# Patient Record
Sex: Female | Born: 1970
Health system: Southern US, Community
[De-identification: ages and names within clinical notes are randomized; demographics above are authoritative.]

## PROBLEM LIST (undated history)

## (undated) DIAGNOSIS — M1990 Unspecified osteoarthritis, unspecified site: Secondary | ICD-10-CM

## (undated) DIAGNOSIS — N8501 Benign endometrial hyperplasia: Secondary | ICD-10-CM

## (undated) DIAGNOSIS — R011 Cardiac murmur, unspecified: Secondary | ICD-10-CM

## (undated) DIAGNOSIS — I10 Essential (primary) hypertension: Secondary | ICD-10-CM

## (undated) DIAGNOSIS — J309 Allergic rhinitis, unspecified: Secondary | ICD-10-CM

## (undated) HISTORY — PX: ENDOMETRIAL BIOPSY: SHX622

## (undated) HISTORY — DX: Benign endometrial hyperplasia: N85.01

## (undated) HISTORY — DX: Unspecified osteoarthritis, unspecified site: M19.90

## (undated) HISTORY — PX: BREAST BIOPSY: SHX20

## (undated) HISTORY — DX: Allergic rhinitis, unspecified: J30.9

## (undated) HISTORY — DX: Essential (primary) hypertension: I10

## (undated) HISTORY — DX: Cardiac murmur, unspecified: R01.1

---

## 2007-02-06 ENCOUNTER — Ambulatory Visit: Payer: Self-pay

## 2007-05-31 DIAGNOSIS — D509 Iron deficiency anemia, unspecified: Secondary | ICD-10-CM | POA: Insufficient documentation

## 2008-02-07 ENCOUNTER — Ambulatory Visit: Payer: Self-pay | Admitting: Family Medicine

## 2008-03-21 HISTORY — PX: HYSTEROSCOPY WITH D & C: SHX1775

## 2008-07-31 ENCOUNTER — Ambulatory Visit: Payer: Self-pay | Admitting: Family Medicine

## 2008-07-31 DIAGNOSIS — N920 Excessive and frequent menstruation with regular cycle: Secondary | ICD-10-CM | POA: Insufficient documentation

## 2008-08-14 DIAGNOSIS — R030 Elevated blood-pressure reading, without diagnosis of hypertension: Secondary | ICD-10-CM | POA: Insufficient documentation

## 2009-01-21 ENCOUNTER — Ambulatory Visit: Payer: Self-pay | Admitting: Unknown Physician Specialty

## 2009-01-29 DIAGNOSIS — E669 Obesity, unspecified: Secondary | ICD-10-CM | POA: Insufficient documentation

## 2009-02-03 ENCOUNTER — Ambulatory Visit: Payer: Self-pay | Admitting: Unknown Physician Specialty

## 2009-02-09 ENCOUNTER — Ambulatory Visit: Payer: Self-pay | Admitting: Family Medicine

## 2009-09-07 DIAGNOSIS — L989 Disorder of the skin and subcutaneous tissue, unspecified: Secondary | ICD-10-CM | POA: Insufficient documentation

## 2009-12-29 ENCOUNTER — Ambulatory Visit: Payer: Self-pay | Admitting: Family Medicine

## 2010-12-30 ENCOUNTER — Ambulatory Visit: Payer: Self-pay | Admitting: Family Medicine

## 2012-01-04 ENCOUNTER — Ambulatory Visit: Payer: Self-pay | Admitting: Family Medicine

## 2012-12-27 ENCOUNTER — Ambulatory Visit: Payer: Self-pay | Admitting: Family Medicine

## 2013-12-19 LAB — LIPID PANEL
Cholesterol: 192 mg/dL (ref 0–200)
HDL: 70 mg/dL (ref 35–70)
LDL Cholesterol: 112 mg/dL
Triglycerides: 51 mg/dL (ref 40–160)

## 2013-12-19 LAB — BASIC METABOLIC PANEL
BUN: 18 mg/dL (ref 4–21)
CREATININE: 0.7 mg/dL (ref 0.5–1.1)
POTASSIUM: 4.4 mmol/L (ref 3.4–5.3)
Sodium: 140 mmol/L (ref 137–147)

## 2013-12-19 LAB — HM PAP SMEAR: HM Pap smear: NEGATIVE

## 2013-12-19 LAB — FECAL OCCULT BLOOD, GUAIAC: FECAL OCCULT BLD: NEGATIVE

## 2013-12-19 LAB — TSH: TSH: 2.09 u[IU]/mL (ref 0.41–5.90)

## 2014-01-07 ENCOUNTER — Ambulatory Visit: Payer: Self-pay | Admitting: Family Medicine

## 2014-01-23 ENCOUNTER — Ambulatory Visit: Payer: Self-pay | Admitting: Family Medicine

## 2014-02-04 ENCOUNTER — Ambulatory Visit: Payer: Self-pay | Admitting: Family Medicine

## 2014-03-28 LAB — CBC AND DIFFERENTIAL
HCT: 42 % (ref 36–46)
HEMOGLOBIN: 13.4 g/dL (ref 12.0–16.0)
Platelets: 257 10*3/uL (ref 150–399)
WBC: 6.9 10*3/mL

## 2014-07-14 LAB — SURGICAL PATHOLOGY

## 2014-12-22 ENCOUNTER — Other Ambulatory Visit: Payer: Self-pay | Admitting: Family Medicine

## 2014-12-22 DIAGNOSIS — Z1231 Encounter for screening mammogram for malignant neoplasm of breast: Secondary | ICD-10-CM

## 2015-01-05 ENCOUNTER — Ambulatory Visit (INDEPENDENT_AMBULATORY_CARE_PROVIDER_SITE_OTHER): Payer: BLUE CROSS/BLUE SHIELD | Admitting: Family Medicine

## 2015-01-05 ENCOUNTER — Encounter: Payer: Self-pay | Admitting: Family Medicine

## 2015-01-05 VITALS — BP 110/60 | HR 56 | Temp 97.9°F | Resp 20 | Ht 64.0 in | Wt 173.0 lb

## 2015-01-05 DIAGNOSIS — T50905A Adverse effect of unspecified drugs, medicaments and biological substances, initial encounter: Secondary | ICD-10-CM

## 2015-01-05 DIAGNOSIS — Z0000 Encounter for general adult medical examination without abnormal findings: Secondary | ICD-10-CM

## 2015-01-05 DIAGNOSIS — R031 Nonspecific low blood-pressure reading: Secondary | ICD-10-CM | POA: Insufficient documentation

## 2015-01-05 DIAGNOSIS — J309 Allergic rhinitis, unspecified: Secondary | ICD-10-CM | POA: Insufficient documentation

## 2015-01-05 DIAGNOSIS — R001 Bradycardia, unspecified: Secondary | ICD-10-CM | POA: Insufficient documentation

## 2015-01-05 DIAGNOSIS — T304 Corrosion of unspecified body region, unspecified degree: Secondary | ICD-10-CM | POA: Insufficient documentation

## 2015-01-05 DIAGNOSIS — J329 Chronic sinusitis, unspecified: Secondary | ICD-10-CM | POA: Insufficient documentation

## 2015-01-05 LAB — POCT URINALYSIS DIPSTICK
Bilirubin, UA: NEGATIVE
Blood, UA: NEGATIVE
Glucose, UA: NEGATIVE
KETONES UA: NEGATIVE
LEUKOCYTES UA: NEGATIVE
Nitrite, UA: NEGATIVE
PH UA: 6
PROTEIN UA: NEGATIVE
SPEC GRAV UA: 1.02
UROBILINOGEN UA: 0.2
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
## 2015-01-06 ENCOUNTER — Telehealth: Payer: Self-pay

## 2015-01-06 LAB — LIPID PANEL WITH LDL/HDL RATIO
Cholesterol, Total: 182 mg/dL (ref 100–199)
HDL: 65 mg/dL (ref >39)
LDL CALC: 103 mg/dL — AB (ref 0–99)
LDL/HDL RATIO: 1.6 ratio (ref 0.0–3.2)
Triglycerides: 70 mg/dL (ref 0–149)
VLDL Cholesterol Cal: 14 mg/dL (ref 5–40)

## 2015-01-06 LAB — TSH: TSH: 3.45 u[IU]/mL (ref 0.450–4.500)

## 2015-01-06 LAB — HEMOGLOBIN A1C
Est. average glucose Bld gHb Est-mCnc: 108 mg/dL
HEMOGLOBIN A1C: 5.4 % (ref 4.8–5.6)

## 2015-01-06 NOTE — Telephone Encounter (Signed)
-----   Message from Margarita Rana, MD sent at 01/06/2015  7:01 AM EDT ----- Labs stable. Please notify patient. Thanks.

## 2015-01-06 NOTE — Telephone Encounter (Signed)
LMTCB 01/06/2015  Thanks,   -Laura 

## 2015-01-06 NOTE — Telephone Encounter (Signed)
Pt advised.   Thanks,   -Flois Mctague  

## 2015-01-06 NOTE — Telephone Encounter (Signed)
Pt is returning call.  CB#[phone]/MW

## 2015-01-09 ENCOUNTER — Ambulatory Visit: Payer: Self-pay

## 2015-01-12 ENCOUNTER — Ambulatory Visit
Admission: RE | Admit: 2015-01-12 | Discharge: 2015-01-12 | Disposition: A | Discharge status: Home / Self Care | Payer: BLUE CROSS/BLUE SHIELD | Source: Ambulatory Visit | Attending: Family Medicine | Admitting: Family Medicine

## 2015-01-12 DIAGNOSIS — Z1231 Encounter for screening mammogram for malignant neoplasm of breast: Secondary | ICD-10-CM | POA: Insufficient documentation

## 2015-03-05 ENCOUNTER — Other Ambulatory Visit: Payer: Self-pay | Admitting: Family Medicine

## 2015-04-13 ENCOUNTER — Encounter: Payer: Self-pay | Admitting: Family Medicine

## 2015-04-13 ENCOUNTER — Ambulatory Visit (INDEPENDENT_AMBULATORY_CARE_PROVIDER_SITE_OTHER): Payer: BLUE CROSS/BLUE SHIELD | Admitting: Family Medicine

## 2015-04-13 VITALS — BP 118/84 | HR 54 | Temp 97.7°F | Resp 16 | Wt 178.6 lb

## 2015-04-13 DIAGNOSIS — J0100 Acute maxillary sinusitis, unspecified: Secondary | ICD-10-CM | POA: Diagnosis not present

## 2015-04-13 MED ORDER — AMOXICILLIN-POT CLAVULANATE 875-125 MG PO TABS: 1.0000 | ORAL_TABLET | Freq: Two times a day (BID) | ORAL | Status: DC

## 2015-04-13 NOTE — Progress Notes (Signed)
Subjective:     Patient ID: Monica Nelson, female   DOB: [birth year], 45 y.o.   MRN: [id number]  HPI  Chief Complaint  Patient presents with  . Sinus Problem    Patient comes in office today with concerns of sinus pain and pressure for the past 4 weeks. Patient states that pressure is around her entire head and she has noticed popping in both ears. Associated symptoms include: non productive cough, post nasal drip and congestion. Patient has tried otc Dayquil, Mucinex and Mucinex Maximum relief.   States her sx started out like a cold 4 weeks ago, now congestion is locked in. Gets transient improvement with otc medication. Has continued her allergy medication-steroid nasal spray and antihistamine-as she is exposed to allergens in her job at an [employer].   Review of Systems     Objective:   Physical Exam  Constitutional: She appears well-developed and well-nourished. No distress.  Ears: Left T.M intact without inflammation, Right obscured by cerumen Sinuses: mild maxillary sinus tenderness Throat: no tonsillar enlargement or exudate Neck: no cervical adenopathy Lungs: clear     Assessment:    1. Acute maxillary sinusitis, recurrence not specified - amoxicillin-clavulanate (AUGMENTIN) 875-125 MG tablet; Take 1 tablet by mouth 2 (two) times daily.  Dispense: 20 tablet; Refill: 0    Plan:   Discussed use of otc medication.

## 2015-04-13 NOTE — Patient Instructions (Signed)
Try Mucinex D for congestion, Delsym for cough, and continue steroid nasal spray.

## 2015-05-28 ENCOUNTER — Telehealth: Payer: Self-pay | Admitting: Family Medicine

## 2015-05-28 NOTE — Telephone Encounter (Signed)
Pt states she has her yearly check up with [facility] and was told she has a functional heat mummer.  Pt is requesting a call back from Dr Venia Minks to discuss this.  CB#[phone]/MW

## 2015-05-28 NOTE — Telephone Encounter (Signed)
Spoke with pt and scheduled appt for 06/04/15. Thanks TNP

## 2015-05-28 NOTE — Telephone Encounter (Signed)
Patient reports that she has a heart murmur. Should I schedule patient an appt to discuss this? Please advise. Thanks!

## 2015-05-28 NOTE — Telephone Encounter (Signed)
Will schedule appointment to assess. Thanks.

## 2015-05-31 ENCOUNTER — Other Ambulatory Visit: Payer: Self-pay | Admitting: Family Medicine

## 2015-05-31 DIAGNOSIS — J3089 Other allergic rhinitis: Secondary | ICD-10-CM

## 2015-06-03 ENCOUNTER — Other Ambulatory Visit: Payer: Self-pay | Admitting: Family Medicine

## 2015-06-03 DIAGNOSIS — J3089 Other allergic rhinitis: Secondary | ICD-10-CM

## 2015-06-04 ENCOUNTER — Encounter: Payer: Self-pay | Admitting: Family Medicine

## 2015-06-04 ENCOUNTER — Ambulatory Visit (INDEPENDENT_AMBULATORY_CARE_PROVIDER_SITE_OTHER): Payer: BLUE CROSS/BLUE SHIELD | Admitting: Family Medicine

## 2015-06-04 VITALS — BP 134/82 | HR 92 | Temp 98.4°F | Resp 16 | Wt 178.0 lb

## 2015-06-04 DIAGNOSIS — R011 Cardiac murmur, unspecified: Secondary | ICD-10-CM

## 2015-06-04 DIAGNOSIS — D509 Iron deficiency anemia, unspecified: Secondary | ICD-10-CM

## 2015-06-04 NOTE — Progress Notes (Signed)
   Subjective:    Patient ID: Monica Nelson, female    DOB: [birth year], 45 y.o.   MRN: [id number]  HPI  Heart Murmur Pt had her yearly check up with [facility] last week and was told she has a functional heart murmur by Dalia Heading, CNM. Pt comes in today to have this assessed. Pt denies previous history of heart murmur or rheumatic fever. [facility] did not check labs. Has not current symptoms. Feels well. No SOB, not chest pain, no fatigue, no syncope, no irregular heart beat.    Review of Systems  Constitutional: Negative for fever, chills, diaphoresis, activity change, appetite change, fatigue and unexpected weight change.  Respiratory: Negative for cough, chest tightness, shortness of breath and wheezing.   Cardiovascular: Negative for chest pain, palpitations and leg swelling.  Neurological: Negative for syncope.       Objective:   Physical Exam  Constitutional: She appears well-developed and well-nourished.  Cardiovascular:  Murmur heard.  Systolic murmur is present with a grade of 2/6  Psychiatric: She has a normal mood and affect. Her behavior is normal.   BP 134/82 mmHg  Pulse 92  Temp(Src) 98.4 F (36.9 C) (Oral)  Resp 16  Wt 178 lb (80.74 kg)     Assessment & Plan:  1. Systolic ejection murmur New diagnosis. No current symptoms. Will refer to cardiology.   Does have some subtle EKG changes noted.  - Ambulatory referral to Cardiology - EKG 12-Lead  2. Anemia, iron deficiency Will check labs.  - CBC with Differential/Platelet   Patient was seen and examined by Jerrell Belfast, MD, and note scribed by Renaldo Fiddler, CMA. I have reviewed the document for accuracy and completeness and I agree with above. Jerrell Belfast, MD   Margarita Rana, MD

## 2015-06-08 DIAGNOSIS — R011 Cardiac murmur, unspecified: Secondary | ICD-10-CM | POA: Insufficient documentation

## 2015-06-22 DIAGNOSIS — R001 Bradycardia, unspecified: Secondary | ICD-10-CM | POA: Diagnosis not present

## 2015-06-22 DIAGNOSIS — R011 Cardiac murmur, unspecified: Secondary | ICD-10-CM | POA: Diagnosis not present

## 2015-12-15 ENCOUNTER — Telehealth: Payer: Self-pay | Admitting: Physician Assistant

## 2015-12-15 DIAGNOSIS — Z1239 Encounter for other screening for malignant neoplasm of breast: Secondary | ICD-10-CM

## 2015-12-15 NOTE — Telephone Encounter (Signed)
Please review. Thanks!  

## 2015-12-15 NOTE — Telephone Encounter (Signed)
Pt called to schedule her mammogram for November but since pt hasn't seen [facility] Sat since Dr. Venia Minks left and her CPE is in October [facility] needs an order from [facility] before they will schedule the mammogram. Pt would like an order sent to [facility] if possible. Pt would like a call back. Please advise. Thanks TNP

## 2015-12-16 NOTE — Telephone Encounter (Signed)
Mammogram ordered

## 2015-12-16 NOTE — Telephone Encounter (Signed)
Advised patient as below.  

## 2016-01-05 ENCOUNTER — Ambulatory Visit (INDEPENDENT_AMBULATORY_CARE_PROVIDER_SITE_OTHER): Payer: BLUE CROSS/BLUE SHIELD | Admitting: Physician Assistant

## 2016-01-05 ENCOUNTER — Encounter: Payer: Self-pay | Admitting: Physician Assistant

## 2016-01-05 VITALS — BP 140/80 | HR 64 | Temp 98.1°F | Resp 16 | Ht 64.0 in | Wt 182.0 lb

## 2016-01-05 DIAGNOSIS — M2550 Pain in unspecified joint: Secondary | ICD-10-CM

## 2016-01-05 DIAGNOSIS — Z136 Encounter for screening for cardiovascular disorders: Secondary | ICD-10-CM

## 2016-01-05 DIAGNOSIS — D508 Other iron deficiency anemias: Secondary | ICD-10-CM | POA: Diagnosis not present

## 2016-01-05 DIAGNOSIS — R030 Elevated blood-pressure reading, without diagnosis of hypertension: Secondary | ICD-10-CM | POA: Diagnosis not present

## 2016-01-05 DIAGNOSIS — J3089 Other allergic rhinitis: Secondary | ICD-10-CM | POA: Diagnosis not present

## 2016-01-05 DIAGNOSIS — Z0000 Encounter for general adult medical examination without abnormal findings: Secondary | ICD-10-CM | POA: Diagnosis not present

## 2016-01-05 DIAGNOSIS — Z1239 Encounter for other screening for malignant neoplasm of breast: Secondary | ICD-10-CM

## 2016-01-05 DIAGNOSIS — Z1231 Encounter for screening mammogram for malignant neoplasm of breast: Secondary | ICD-10-CM | POA: Diagnosis not present

## 2016-01-05 DIAGNOSIS — Z13220 Encounter for screening for lipoid disorders: Secondary | ICD-10-CM

## 2016-01-05 LAB — POCT URINALYSIS DIPSTICK
BILIRUBIN UA: NEGATIVE
Blood, UA: NEGATIVE
Glucose, UA: NEGATIVE
KETONES UA: NEGATIVE
LEUKOCYTES UA: NEGATIVE
Nitrite, UA: NEGATIVE
PH UA: 6
Protein, UA: NEGATIVE
SPEC GRAV UA: 1.01
Urobilinogen, UA: 0.2

## 2016-01-05 MED ORDER — FLUTICASONE PROPIONATE 50 MCG/ACT NA SUSP: NASAL | 2 refills | Status: DC

## 2016-01-05 MED ORDER — IBUPROFEN 800 MG PO TABS
800.0000 mg | ORAL_TABLET | Freq: Two times a day (BID) | ORAL | 2 refills | Status: DC
Start: 2016-01-05 — End: 2018-07-04

## 2016-01-05 NOTE — Patient Instructions (Signed)
Health Maintenance, Female Adopting a healthy lifestyle and getting preventive care can go a long way to promote health and wellness. Talk with your health care provider about what schedule of regular examinations is right for you. This is a good chance for you to check in with your provider about disease prevention and staying healthy. In between checkups, there are plenty of things you can do on your own. Experts have done a lot of research about which lifestyle changes and preventive measures are most likely to keep you healthy. Ask your health care provider for more information. WEIGHT AND DIET  Eat a healthy diet  Be sure to include plenty of vegetables, fruits, low-fat dairy products, and lean protein.  Do not eat a lot of foods high in solid fats, added sugars, or salt.  Get regular exercise. This is one of the most important things you can do for your health.  Most adults should exercise for at least 150 minutes each week. The exercise should increase your heart rate and make you sweat (moderate-intensity exercise).  Most adults should also do strengthening exercises at least twice a week. This is in addition to the moderate-intensity exercise.  Maintain a healthy weight  Body mass index (BMI) is a measurement that can be used to identify possible weight problems. It estimates body fat based on height and weight. Your health care provider can help determine your BMI and help you achieve or maintain a healthy weight.  For females 28 years of age and older:   A BMI below 18.5 is considered underweight.  A BMI of 18.5 to 24.9 is normal.  A BMI of 25 to 29.9 is considered overweight.  A BMI of 30 and above is considered obese.  Watch levels of cholesterol and blood lipids  You should start having your blood tested for lipids and cholesterol at 45 years of age, then have this test every 5 years.  You may need to have your cholesterol levels checked more often if:  Your lipid  or cholesterol levels are high.  You are older than 45 years of age.  You are at high risk for heart disease.  CANCER SCREENING   Lung Cancer  Lung cancer screening is recommended for adults 75-66 years old who are at high risk for lung cancer because of a history of smoking.  A yearly low-dose CT scan of the lungs is recommended for people who:  Currently smoke.  Have quit within the past 15 years.  Have at least a 30-pack-year history of smoking. A pack year is smoking an average of one pack of cigarettes a day for 1 year.  Yearly screening should continue until it has been 15 years since you quit.  Yearly screening should stop if you develop a health problem that would prevent you from having lung cancer treatment.  Breast Cancer  Practice breast self-awareness. This means understanding how your breasts normally appear and feel.  It also means doing regular breast self-exams. Let your health care provider know about any changes, no matter how small.  If you are in your 20s or 30s, you should have a clinical breast exam (CBE) by a health care provider every 1-3 years as part of a regular health exam.  If you are 25 or older, have a CBE every year. Also consider having a breast X-ray (mammogram) every year.  If you have a family history of breast cancer, talk to your health care provider about genetic screening.  If you  are at high risk for breast cancer, talk to your health care provider about having an MRI and a mammogram every year.  Breast cancer gene (BRCA) assessment is recommended for women who have family members with BRCA-related cancers. BRCA-related cancers include:  Breast.  Ovarian.  Tubal.  Peritoneal cancers.  Results of the assessment will determine the need for genetic counseling and BRCA1 and BRCA2 testing. Cervical Cancer Your health care provider may recommend that you be screened regularly for cancer of the pelvic organs (ovaries, uterus, and  vagina). This screening involves a pelvic examination, including checking for microscopic changes to the surface of your cervix (Pap test). You may be encouraged to have this screening done every 3 years, beginning at age 21.  For women ages 30-65, health care providers may recommend pelvic exams and Pap testing every 3 years, or they may recommend the Pap and pelvic exam, combined with testing for human papilloma virus (HPV), every 5 years. Some types of HPV increase your risk of cervical cancer. Testing for HPV may also be done on women of any age with unclear Pap test results.  Other health care providers may not recommend any screening for nonpregnant women who are considered low risk for pelvic cancer and who do not have symptoms. Ask your health care provider if a screening pelvic exam is right for you.  If you have had past treatment for cervical cancer or a condition that could lead to cancer, you need Pap tests and screening for cancer for at least 20 years after your treatment. If Pap tests have been discontinued, your risk factors (such as having a new sexual partner) need to be reassessed to determine if screening should resume. Some women have medical problems that increase the chance of getting cervical cancer. In these cases, your health care provider may recommend more frequent screening and Pap tests. Colorectal Cancer  This type of cancer can be detected and often prevented.  Routine colorectal cancer screening usually begins at 45 years of age and continues through 45 years of age.  Your health care provider may recommend screening at an earlier age if you have risk factors for colon cancer.  Your health care provider may also recommend using home test kits to check for hidden blood in the stool.  A small camera at the end of a tube can be used to examine your colon directly (sigmoidoscopy or colonoscopy). This is done to check for the earliest forms of colorectal  cancer.  Routine screening usually begins at age 50.  Direct examination of the colon should be repeated every 5-10 years through 45 years of age. However, you may need to be screened more often if early forms of precancerous polyps or small growths are found. Skin Cancer  Check your skin from head to toe regularly.  Tell your health care provider about any new moles or changes in moles, especially if there is a change in a mole's shape or color.  Also tell your health care provider if you have a mole that is larger than the size of a pencil eraser.  Always use sunscreen. Apply sunscreen liberally and repeatedly throughout the day.  Protect yourself by wearing long sleeves, pants, a wide-brimmed hat, and sunglasses whenever you are outside. HEART DISEASE, DIABETES, AND HIGH BLOOD PRESSURE   High blood pressure causes heart disease and increases the risk of stroke. High blood pressure is more likely to develop in:  People who have blood pressure in the high end   of the normal range (130-139/85-89 mm Hg).  People who are overweight or obese.  People who are African American.  If you are 38-23 years of age, have your blood pressure checked every 3-5 years. If you are 61 years of age or older, have your blood pressure checked every year. You should have your blood pressure measured twice--once when you are at a hospital or clinic, and once when you are not at a hospital or clinic. Record the average of the two measurements. To check your blood pressure when you are not at a hospital or clinic, you can use:  An automated blood pressure machine at a pharmacy.  A home blood pressure monitor.  If you are between 45 years and 39 years old, ask your health care provider if you should take aspirin to prevent strokes.  Have regular diabetes screenings. This involves taking a blood sample to check your fasting blood sugar level.  If you are at a normal weight and have a low risk for diabetes,  have this test once every three years after 45 years of age.  If you are overweight and have a high risk for diabetes, consider being tested at a younger age or more often. PREVENTING INFECTION  Hepatitis B  If you have a higher risk for hepatitis B, you should be screened for this virus. You are considered at high risk for hepatitis B if:  You were born in a country where hepatitis B is common. Ask your health care provider which countries are considered high risk.  Your parents were born in a high-risk country, and you have not been immunized against hepatitis B (hepatitis B vaccine).  You have HIV or AIDS.  You use needles to inject street drugs.  You live with someone who has hepatitis B.  You have had sex with someone who has hepatitis B.  You get hemodialysis treatment.  You take certain medicines for conditions, including cancer, organ transplantation, and autoimmune conditions. Hepatitis C  Blood testing is recommended for:  Everyone born from [birth year] through 1965.  Anyone with known risk factors for hepatitis C. Sexually transmitted infections (STIs)  You should be screened for sexually transmitted infections (STIs) including gonorrhea and chlamydia if:  You are sexually active and are younger than 45 years of age.  You are older than 45 years of age and your health care provider tells you that you are at risk for this type of infection.  Your sexual activity has changed since you were last screened and you are at an increased risk for chlamydia or gonorrhea. Ask your health care provider if you are at risk.  If you do not have HIV, but are at risk, it may be recommended that you take a prescription medicine daily to prevent HIV infection. This is called pre-exposure prophylaxis (PrEP). You are considered at risk if:  You are sexually active and do not regularly use condoms or know the HIV status of your partner(s).  You take drugs by injection.  You are sexually  active with a partner who has HIV. Talk with your health care provider about whether you are at high risk of being infected with HIV. If you choose to begin PrEP, you should first be tested for HIV. You should then be tested every 3 months for as long as you are taking PrEP.  PREGNANCY   If you are premenopausal and you may become pregnant, ask your health care provider about preconception counseling.  If you may  become pregnant, take 400 to 800 micrograms (mcg) of folic acid every day.  If you want to prevent pregnancy, talk to your health care provider about birth control (contraception). OSTEOPOROSIS AND MENOPAUSE   Osteoporosis is a disease in which the bones lose minerals and strength with aging. This can result in serious bone fractures. Your risk for osteoporosis can be identified using a bone density scan.  If you are 61 years of age or older, or if you are at risk for osteoporosis and fractures, ask your health care provider if you should be screened.  Ask your health care provider whether you should take a calcium or vitamin D supplement to lower your risk for osteoporosis.  Menopause may have certain physical symptoms and risks.  Hormone replacement therapy may reduce some of these symptoms and risks. Talk to your health care provider about whether hormone replacement therapy is right for you.  HOME CARE INSTRUCTIONS   Schedule regular health, dental, and eye exams.  Stay current with your immunizations.   Do not use any tobacco products including cigarettes, chewing tobacco, or electronic cigarettes.  If you are pregnant, do not drink alcohol.  If you are breastfeeding, limit how much and how often you drink alcohol.  Limit alcohol intake to no more than 1 drink per day for nonpregnant women. One drink equals 12 ounces of beer, 5 ounces of wine, or 1 ounces of hard liquor.  Do not use street drugs.  Do not share needles.  Ask your health care provider for help if  you need support or information about quitting drugs.  Tell your health care provider if you often feel depressed.  Tell your health care provider if you have ever been abused or do not feel safe at home.   This information is not intended to replace advice given to you by your health care provider. Make sure you discuss any questions you have with your health care provider.   Document Released: 09/20/2010 Document Revised: 03/28/2014 Document Reviewed: 02/06/2013 Elsevier Interactive Patient Education [employer].
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
## 2016-01-06 ENCOUNTER — Telehealth: Payer: Self-pay

## 2016-01-06 ENCOUNTER — Encounter: Payer: BLUE CROSS/BLUE SHIELD | Admitting: Family Medicine

## 2016-01-06 LAB — COMPREHENSIVE METABOLIC PANEL
ALT: 13 IU/L (ref 0–32)
AST: 16 IU/L (ref 0–40)
Albumin/Globulin Ratio: 1.7 (ref 1.2–2.2)
Albumin: 4.3 g/dL (ref 3.5–5.5)
Alkaline Phosphatase: 63 IU/L (ref 39–117)
BILIRUBIN TOTAL: 0.4 mg/dL (ref 0.0–1.2)
BUN/Creatinine Ratio: 25 — ABNORMAL HIGH (ref 9–23)
BUN: 16 mg/dL (ref 6–24)
CALCIUM: 9.4 mg/dL (ref 8.7–10.2)
CHLORIDE: 98 mmol/L (ref 96–106)
CO2: 27 mmol/L (ref 18–29)
CREATININE: 0.63 mg/dL (ref 0.57–1.00)
GFR, EST AFRICAN AMERICAN: 125 mL/min/{1.73_m2} (ref >59)
GFR, EST NON AFRICAN AMERICAN: 109 mL/min/{1.73_m2} (ref >59)
GLUCOSE: 90 mg/dL (ref 65–99)
Globulin, Total: 2.6 g/dL (ref 1.5–4.5)
Potassium: 4.4 mmol/L (ref 3.5–5.2)
Sodium: 140 mmol/L (ref 134–144)
TOTAL PROTEIN: 6.9 g/dL (ref 6.0–8.5)

## 2016-01-06 LAB — LIPID PANEL
Chol/HDL Ratio: 2.6 ratio units (ref 0.0–4.4)
Cholesterol, Total: 182 mg/dL (ref 100–199)
HDL: 70 mg/dL (ref >39)
LDL Calculated: 104 mg/dL — ABNORMAL HIGH (ref 0–99)
Triglycerides: 42 mg/dL (ref 0–149)
VLDL CHOLESTEROL CAL: 8 mg/dL (ref 5–40)

## 2016-01-06 LAB — CBC WITH DIFFERENTIAL/PLATELET
BASOS ABS: 0 10*3/uL (ref 0.0–0.2)
Basos: 0 %
EOS (ABSOLUTE): 0.3 10*3/uL (ref 0.0–0.4)
Eos: 4 %
Hematocrit: 44.1 % (ref 34.0–46.6)
Hemoglobin: 14.7 g/dL (ref 11.1–15.9)
IMMATURE GRANS (ABS): 0 10*3/uL (ref 0.0–0.1)
IMMATURE GRANULOCYTES: 0 %
LYMPHS: 20 %
Lymphocytes Absolute: 1.6 10*3/uL (ref 0.7–3.1)
MCH: 29.6 pg (ref 26.6–33.0)
MCHC: 33.3 g/dL (ref 31.5–35.7)
MCV: 89 fL (ref 79–97)
MONOCYTES: 7 %
Monocytes Absolute: 0.5 10*3/uL (ref 0.1–0.9)
NEUTROS PCT: 69 %
Neutrophils Absolute: 5.4 10*3/uL (ref 1.4–7.0)
PLATELETS: 279 10*3/uL (ref 150–379)
RBC: 4.96 x10E6/uL (ref 3.77–5.28)
RDW: 13.9 % (ref 12.3–15.4)
WBC: 7.8 10*3/uL (ref 3.4–10.8)

## 2016-01-06 LAB — TSH: TSH: 3.06 u[IU]/mL (ref 0.450–4.500)

## 2016-01-06 NOTE — Telephone Encounter (Signed)
Patient advised and voiced understanding.  

## 2016-01-06 NOTE — Telephone Encounter (Signed)
-----   Message from Mar Daring, PA-C sent at 01/06/2016  9:19 AM EDT ----- All labs are within normal limits and stable.  Thanks! -JB

## 2016-01-06 NOTE — Telephone Encounter (Signed)
Tried calling patient. Left message for patient to call back.  

## 2016-01-26 ENCOUNTER — Ambulatory Visit
Admission: RE | Admit: 2016-01-26 | Discharge: 2016-01-26 | Disposition: A | Discharge status: Home / Self Care | Payer: BLUE CROSS/BLUE SHIELD | Source: Ambulatory Visit | Attending: Physician Assistant | Admitting: Physician Assistant

## 2016-01-26 DIAGNOSIS — Z1231 Encounter for screening mammogram for malignant neoplasm of breast: Secondary | ICD-10-CM | POA: Insufficient documentation

## 2016-01-26 DIAGNOSIS — Z1239 Encounter for other screening for malignant neoplasm of breast: Secondary | ICD-10-CM

## 2016-01-29 ENCOUNTER — Telehealth: Payer: Self-pay

## 2016-01-29 NOTE — Telephone Encounter (Signed)
-----   Message from Mar Daring, [place] sent at 01/29/2016 10:00 AM EST ----- Normal mammogram. Repeat screening in one year.

## 2016-01-29 NOTE — Telephone Encounter (Signed)
Patient advised as below.  

## 2016-03-31 ENCOUNTER — Ambulatory Visit (INDEPENDENT_AMBULATORY_CARE_PROVIDER_SITE_OTHER): Payer: BLUE CROSS/BLUE SHIELD | Admitting: Physician Assistant

## 2016-03-31 ENCOUNTER — Encounter: Payer: Self-pay | Admitting: Physician Assistant

## 2016-03-31 ENCOUNTER — Ambulatory Visit: Payer: BLUE CROSS/BLUE SHIELD | Admitting: Physician Assistant

## 2016-03-31 VITALS — BP 140/90 | HR 68 | Temp 97.9°F | Resp 16 | Wt 180.0 lb

## 2016-03-31 DIAGNOSIS — J0101 Acute recurrent maxillary sinusitis: Secondary | ICD-10-CM

## 2016-03-31 DIAGNOSIS — R05 Cough: Secondary | ICD-10-CM

## 2016-03-31 DIAGNOSIS — R059 Cough, unspecified: Secondary | ICD-10-CM

## 2016-03-31 MED ORDER — HYDROCODONE-HOMATROPINE 5-1.5 MG/5ML PO SYRP
5.0000 mL | ORAL_SOLUTION | Freq: Three times a day (TID) | ORAL | 0 refills | Status: DC | PRN
Start: 2016-03-31 — End: 2016-07-29

## 2016-03-31 MED ORDER — AMOXICILLIN-POT CLAVULANATE 875-125 MG PO TABS: 1.0000 | ORAL_TABLET | Freq: Two times a day (BID) | ORAL | 0 refills | Status: DC

## 2016-03-31 NOTE — Patient Instructions (Signed)
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
## 2016-04-14 ENCOUNTER — Other Ambulatory Visit: Payer: Self-pay | Admitting: Physician Assistant

## 2016-04-14 DIAGNOSIS — J0101 Acute recurrent maxillary sinusitis: Secondary | ICD-10-CM

## 2016-04-14 MED ORDER — AMOXICILLIN-POT CLAVULANATE 875-125 MG PO TABS: 1.0000 | ORAL_TABLET | Freq: Two times a day (BID) | ORAL | 0 refills | Status: DC

## 2016-04-14 NOTE — Telephone Encounter (Signed)
Pt stated when she saw Tawanna Sat she was advised to call back when she had finished the amoxicillin-clavulanate (AUGMENTIN) 875-125 MG tablet b/c she may need another round. Pt stated she finished the medication about 2 days ago and she feels a lot better but still feels some pressure in her ears and would like to get another round of the medication sent to [facility] [place] was advised that Tawanna Sat is out of the office. Please advise. Thanks TNP

## 2016-04-14 NOTE — Telephone Encounter (Signed)
Patient advised that prescription has been sent to the pharmacy.

## 2016-04-14 NOTE — Telephone Encounter (Signed)
Please review.  Thanks,  -Catricia Scheerer 

## 2016-07-29 ENCOUNTER — Encounter: Payer: Self-pay | Admitting: Certified Nurse Midwife

## 2016-07-29 ENCOUNTER — Ambulatory Visit (INDEPENDENT_AMBULATORY_CARE_PROVIDER_SITE_OTHER): Payer: BLUE CROSS/BLUE SHIELD | Admitting: Certified Nurse Midwife

## 2016-07-29 VITALS — BP 124/74 | HR 98 | Ht 64.0 in | Wt 187.0 lb

## 2016-07-29 DIAGNOSIS — Z124 Encounter for screening for malignant neoplasm of cervix: Secondary | ICD-10-CM | POA: Diagnosis not present

## 2016-07-29 DIAGNOSIS — Z01419 Encounter for gynecological examination (general) (routine) without abnormal findings: Secondary | ICD-10-CM | POA: Diagnosis not present

## 2016-07-29 DIAGNOSIS — Z975 Presence of (intrauterine) contraceptive device: Secondary | ICD-10-CM

## 2016-07-29 NOTE — Progress Notes (Signed)
Gynecology Annual Exam  PCP: Margarita Rana, MD  Chief Complaint:  Chief Complaint  Patient presents with  . Gynecologic Exam    History of Present Illness: Monica Nelson is a 46 year old Caucasian/White female, G0 P0000, who presents for her annual exam. She is having no significant GYN problems.  Her menses are absent due to her Mirena IUD.  She has had one episode of spotting about 2 months ago and she has had no hot flashes..   The patient's past medical history is notable for a history of a history of complex endometrial hyperplasia without atypia, currently being treated with Mirena IUD which was replaced 03/28/2014. Had two endometrial biopsies after her D&C in 2010 which showed proliferative endometrium.  Since her last annual GYN exam dated 05/27/2015 she has had no significant changes in her health history.   Her most recent pap smear was obtained 05/27/2015 and was with negative cells and negative HPV DNA.  Her most recent mammogram obtained on 01/28/2016 was negative.  There is no family history of breast cancer.  There is no family history of ovarian cancer.  The patient does do occ monthly self breast exams.  The patient does not smoke.  The patient does drink alcohol occasionally The patient does not use illegal drugs.  The patient does not exercise, but she is very active at her jobs.  The patient does get adequate calcium in her diet and she takes a vitamin D3 supplement She had a recent cholesterol screen in 2017 by PCP [facility] ([facility]) that was normal.     Review of Systems: Review of Systems  Constitutional: Negative for chills, fever and weight loss.  HENT: Negative for congestion, sinus pain and sore throat.   Eyes: Negative for blurred vision and pain.  Respiratory: Negative for hemoptysis, shortness of breath and wheezing.   Cardiovascular: Negative for chest pain, palpitations and leg swelling.  Gastrointestinal: Negative for abdominal pain,  blood in stool, diarrhea, heartburn, nausea and vomiting.  Genitourinary: Negative for dysuria, frequency, hematuria and urgency.  Musculoskeletal: Negative for back pain, joint pain and myalgias.  Skin: Negative for itching and rash.  Neurological: Negative for dizziness, tingling and headaches.  Endo/Heme/Allergies: Negative for environmental allergies and polydipsia. Does not bruise/bleed easily.       Negative for hirsutism   Psychiatric/Behavioral: Negative for depression. The patient is not nervous/anxious and does not have insomnia.     Past Medical History:  Past Medical History:  Diagnosis Date  . Allergic rhinitis   . Complex endometrial hyperplasia without atypia    currently treat with Mirena  . Hypertension     Past Surgical History:  Past Surgical History:  Procedure Laterality Date  . BREAST BIOPSY Left 02/04/2014 x 2   negative  . ENDOMETRIAL BIOPSY     May 2010 complex hyperplasia, Nov 2011 prolif endometrium, 2014 prolif  . HYSTEROSCOPY W/D&C  2010       Family History:  Family History  Problem Relation Age of Onset  . Healthy Mother   . Thyroid disease Mother   . Bone cancer Father   . Breast cancer Neg Hx     Social History:  Social History   Social History  . Marital status: Single    Spouse name: N/A  . Number of children: N/A  . Years of education: N/A   Occupational History  . Academic librarian Specialist    Social History Main Topics  . Smoking status:  Never Smoker  . Smokeless tobacco: Never Used  . Alcohol use Yes     Comment: occasional  . Drug use: No  . Sexual activity: Yes    Birth control/ protection: IUD   Other Topics Concern  . Not on file   Social History Narrative   Also works in Management consultant office    Allergies:  No Known Allergies  Medications: Prior to Admission medications   Medication Sig Start Date End Date Taking? Authorizing Provider  Ascorbic Acid (VITAMIN C) 1000 MG tablet Take 1,000 mg by  mouth daily.    Yes [provider]  cetirizine (ZYRTEC) 10 MG tablet Take 10 mg by mouth daily.   Yes [provider]  fluticasone (FLONASE) 50 MCG/ACT nasal spray USE 2 SPRAYS EACH NOSTRIL EVERY DAY 01/05/16  Yes Mar Daring, PA-C  ibuprofen (ADVIL,MOTRIN) 800 MG tablet Take 1 tablet (800 mg total) by mouth 2 (two) times daily. 01/05/16  Yes [place], Clearnce Sorrel, PA-C  levonorgestrel (MIRENA, 52 MG,) 20 MCG/24HR IUD MIRENA, 20MCG/24HR (Intrauterine Intrauterine Device)  for 0 days  Quantity: 0.00;  Refills: 0   Ordered :19-Nov-2009  Ashley Royalty ;  Started 29-Jun-2009 Active Comments: DX: 626.2 06/29/09  Yes [provider]  Misc Natural Products (OSTEO BI-FLEX ADV JOINT SHIELD) TABS Take 2 tablets by mouth daily.    Yes [provider]    Physical Exam Vitals: Blood pressure 124/74, pulse 98, height 5\' 4"  (1.626 m), weight 187 lb (84.8 kg), BMI 32.10 kg/m2  General: NAD HEENT: normocephalic, anicteric Thyroid: no enlargement, no palpable nodules Pulmonary: No increased work of breathing, CTAB Cardiovascular: RRR with GradeII/VI systolic murmur best heard at base (had echo since last visit that was normal) Breast: Breast symmetrical, no tenderness, no palpable nodules or masses, no skin or nipple retraction present, no nipple discharge.  No axillary, infraclavicular, or supraclavicular lymphadenopathy. Abdomen: soft, non-tender, non-distended.  Umbilicus without lesions.  No hepatomegaly or masses palpable. No evidence of hernia  Genitourinary:  External: Normal external female genitalia.  Normal urethral meatus, normal Bartholin's and Skene's glands.    Vagina: Normal vaginal mucosa, no evidence of prolapse.    Cervix: Grossly normal in appearance, posterior,  no bleeding, IUD strings palpated  Uterus: AV, mobile, NSSC, NT  Adnexa: no adnexal masses, NT  Rectal: deferred  Lymphatic: no evidence of inguinal lymphadenopathy Extremities: no  edema, erythema, or tenderness Neurologic: Grossly intact Psychiatric: mood appropriate, affect full     Assessment: 46 y.o. G0P0000 well woman exam IUD in place  Plan:  1) Mammogram - recommend yearly screening mammogram.  Mammogram due after 01/27/2017  2) Pap smear done  3) Routine maintenance including cholesterol, diabetes screening  managed by PCP  Dalia Heading, CNM

## 2016-08-02 LAB — IGP,RFX APTIMA HPV ALL PTH: PAP SMEAR COMMENT: 0

## 2016-10-06 ENCOUNTER — Encounter: Payer: Self-pay | Admitting: Physician Assistant

## 2016-10-06 ENCOUNTER — Ambulatory Visit (INDEPENDENT_AMBULATORY_CARE_PROVIDER_SITE_OTHER): Payer: BLUE CROSS/BLUE SHIELD | Admitting: Physician Assistant

## 2016-10-06 VITALS — BP 120/80 | HR 60 | Temp 98.3°F | Resp 16 | Wt 184.0 lb

## 2016-10-06 DIAGNOSIS — B0230 Zoster ocular disease, unspecified: Secondary | ICD-10-CM | POA: Diagnosis not present

## 2016-10-06 DIAGNOSIS — B029 Zoster without complications: Secondary | ICD-10-CM | POA: Diagnosis not present

## 2016-10-06 MED ORDER — VALACYCLOVIR HCL 1 G PO TABS: 1000.0000 mg | ORAL_TABLET | Freq: Two times a day (BID) | ORAL | 0 refills | Status: DC

## 2016-10-06 NOTE — Progress Notes (Signed)
Patient: Monica Nelson Female    DOB: 11-04-1970   46 y.o.   MRN: [id number] Visit Date: 10/06/2016  Today's Provider: Mar Daring, PA-C   Chief Complaint  Patient presents with  . Rash   Subjective:    HPI Patient here today C/O rash around eyes x's a few days. Patient reports rash started out on left ear and has now spread to scalp and around left eye. She does report some blurred vision in the left eye started yesterday. No eye pain, double vision, eye discharge or eye redness. Patient reports rash "burns". Patient denies fever, cough, runny nose or any other symptoms.     No Known Allergies   Current Outpatient Prescriptions:  .  Ascorbic Acid (VITAMIN C) 1000 MG tablet, Take 1,000 mg by mouth daily. , Disp: , Rfl:  .  cetirizine (ZYRTEC) 10 MG tablet, Take 10 mg by mouth daily., Disp: , Rfl:  .  fluticasone (FLONASE) 50 MCG/ACT nasal spray, USE 2 SPRAYS EACH NOSTRIL EVERY DAY, Disp: 48 g, Rfl: 2 .  ibuprofen (ADVIL,MOTRIN) 800 MG tablet, Take 1 tablet (800 mg total) by mouth 2 (two) times daily., Disp: 60 tablet, Rfl: 2 .  levonorgestrel (MIRENA, 52 MG,) 20 MCG/24HR IUD, MIRENA, 20MCG/24HR (Intrauterine Intrauterine Device)  for 0 days  Quantity: 0.00;  Refills: 0   Ordered :19-Nov-2009  Monica Nelson ;  Started 29-Jun-2009 Active Comments: DX: 626.2, Disp: , Rfl:  .  Misc Natural Products (OSTEO BI-FLEX ADV JOINT SHIELD) TABS, Take 2 tablets by mouth daily. , Disp: , Rfl:   Review of Systems  Constitutional: Negative.   Eyes: Positive for visual disturbance (blurred vision in the left eye).  Respiratory: Negative.   Cardiovascular: Negative.   Skin: Positive for rash.  Neurological: Negative.     Social History  Substance Use Topics  . Smoking status: Never Smoker  . Smokeless tobacco: Never Used  . Alcohol use Yes     Comment: occasional   Objective:   BP 120/80 (BP Location: Left Arm, Patient Position: Sitting, Cuff Size: Large)   Pulse 60    Temp 98.3 F (36.8 C) (Oral)   Resp 16   Wt 184 lb (83.5 kg)   SpO2 99%   BMI 31.58 kg/m  Vitals:   10/06/16 0907  BP: 120/80  Pulse: 60  Resp: 16  Temp: 98.3 F (36.8 C)  TempSrc: Oral  SpO2: 99%  Weight: 184 lb (83.5 kg)     Physical Exam  Constitutional: She appears well-developed and well-nourished. No distress.  HENT:  Head: Normocephalic and atraumatic.  Right Ear: Hearing, tympanic membrane, external ear and ear canal normal.  Left Ear: Hearing, tympanic membrane, external ear and ear canal normal.  Nose: Nose normal.  Mouth/Throat: Uvula is midline, oropharynx is clear and moist and mucous membranes are normal. No oropharyngeal exudate.  Eyes: Pupils are equal, round, and reactive to light. Conjunctivae are normal. Right eye exhibits no discharge. Left eye exhibits no discharge. No scleral icterus.  Neck: Normal range of motion. Neck supple.  Cardiovascular: Normal rate and regular rhythm.  Exam reveals no gallop and no friction rub.   Murmur (MVP) heard. Pulmonary/Chest: Effort normal and breath sounds normal. No respiratory distress. She has no wheezes. She has no rales.  Skin: Skin is warm and dry. Rash noted. Rash is vesicular. She is not diaphoretic.     Vitals reviewed.      Assessment & Plan:  1. Herpes zoster without complication Not quite typical of shingles but highly suspicious. Will treat with valtrex as below due to high risk associated with complications from shingles. Since patient is having blurred vision she was also given an appt to [facility] today for further evaluation of the eye. She is to call if rash does not improve. If not, I do have suspicion this may be eczematous. - valACYclovir (VALTREX) 1000 MG tablet; Take 1 tablet (1,000 mg total) by mouth 2 (two) times daily.  Dispense: 14 tablet; Refill: 0 - Ambulatory referral to Ophthalmology       Mar Daring, PA-C  [facility]

## 2016-10-06 NOTE — Patient Instructions (Addendum)
Valacyclovir caplets What is this medicine? VALACYCLOVIR (val ay SYE kloe veer) is an antiviral medicine. It is used to treat or prevent infections caused by certain kinds of viruses. Examples of these infections include herpes and shingles. This medicine will not cure herpes. This medicine may be used for other purposes; ask your health care provider or pharmacist if you have questions. COMMON BRAND NAME(S): Valtrex What should I tell my health care provider before I take this medicine? They need to know if you have any of these conditions: -acquired immunodeficiency syndrome (AIDS) -any other condition that may weaken the immune system -bone marrow or kidney transplant -kidney disease -an unusual or allergic reaction to valacyclovir, acyclovir, ganciclovir, valganciclovir, other medicines, foods, dyes, or preservatives -pregnant or trying to get pregnant -breast-feeding How should I use this medicine? Take this medicine by mouth with a glass of water. Follow the directions on the prescription label. You can take this medicine with or without food. Take your doses at regular intervals. Do not take your medicine more often than directed. Finish the full course prescribed by your doctor or health care professional even if you think your condition is better. Do not stop taking except on the advice of your doctor or health care professional. Talk to your pediatrician regarding the use of this medicine in children. While this drug may be prescribed for children as young as 2 years for selected conditions, precautions do apply. Overdosage: If you think you have taken too much of this medicine contact a poison control center or emergency room at once. NOTE: This medicine is only for you. Do not share this medicine with others. What if I miss a dose? If you miss a dose, take it as soon as you can. If it is almost time for your next dose, take only that dose. Do not take double or extra doses. What may  interact with this medicine? -cimetidine -probenecid This list may not describe all possible interactions. Give your health care provider a list of all the medicines, herbs, non-prescription drugs, or dietary supplements you use. Also tell them if you smoke, drink alcohol, or use illegal drugs. Some items may interact with your medicine. What should I watch for while using this medicine? Tell your doctor or health care professional if your symptoms do not start to get better after 1 week. This medicine works best when taken early in the course of an infection, within the first 59 hours. Begin treatment as soon as possible after the first signs of infection like tingling, itching, or pain in the affected area. It is possible that genital herpes may still be spread even when you are not having symptoms. Always use safer sex practices like condoms made of latex or polyurethane whenever you have sexual contact. You should stay well hydrated while taking this medicine. Drink plenty of fluids. What side effects may I notice from receiving this medicine? Side effects that you should report to your doctor or health care professional as soon as possible: -allergic reactions like skin rash, itching or hives, swelling of the face, lips, or tongue -aggressive behavior -confusion -hallucinations -problems with balance, talking, walking -stomach pain -tremor -trouble passing urine or change in the amount of urine Side effects that usually do not require medical attention (report to your doctor or health care professional if they continue or are bothersome): -dizziness -headache -nausea, vomiting This list may not describe all possible side effects. Call your doctor for medical advice about side effects. You  may report side effects to FDA at 1-800-FDA-1088. Where should I keep my medicine? Keep out of the reach of children. Store at room temperature between 15 and 25 degrees C (59 and 77 degrees F). Keep  container tightly closed. Throw away any unused medicine after the expiration date. NOTE: This sheet is a summary. It may not cover all possible information. If you have questions about this medicine, talk to your doctor, pharmacist, or health care provider.  2018 Elsevier/Gold Standard (2012-02-21 16:34:05) Shingles Shingles, which is also known as herpes zoster, is an infection that causes a painful skin rash and fluid-filled blisters. Shingles is not related to genital herpes, which is a sexually transmitted infection. Shingles only develops in people who:  Have had chickenpox.  Have received the chickenpox vaccine. (This is rare.)  What are the causes? Shingles is caused by varicella-zoster virus (VZV). This is the same virus that causes chickenpox. After exposure to VZV, the virus stays in the body in an inactive (dormant) state. Shingles develops if the virus reactivates. This can happen many years after the initial exposure to VZV. It is not known what causes this virus to reactivate. What increases the risk? People who have had chickenpox or received the chickenpox vaccine are at risk for shingles. Infection is more common in people who:  Are older than age 14.  Have a weakened defense (immune) system, such as those with HIV, AIDS, or cancer.  Are taking medicines that weaken the immune system, such as transplant medicines.  Are under great stress.  What are the signs or symptoms? Early symptoms of this condition include itching, tingling, and pain in an area on your skin. Pain may be described as burning, stabbing, or throbbing. A few days or weeks after symptoms start, a painful red rash appears, usually on one side of the body in a bandlike or beltlike pattern. The rash eventually turns into fluid-filled blisters that break open, scab over, and dry up in about 2-3 weeks. At any time during the infection, you may also develop:  A fever.  Chills.  A headache.  An upset  stomach.  How is this diagnosed? This condition is diagnosed with a skin exam. Sometimes, skin or fluid samples are taken from the blisters before a diagnosis is made. These samples are examined under a microscope or sent to a lab for testing. How is this treated? There is no specific cure for this condition. Your health care provider will probably prescribe medicines to help you manage pain, recover more quickly, and avoid long-term problems. Medicines may include:  Antiviral drugs.  Anti-inflammatory drugs.  Pain medicines.  If the area involved is on your face, you may be referred to a specialist, such as an eye doctor (ophthalmologist) or an ear, nose, and throat (ENT) doctor to help you avoid eye problems, chronic pain, or disability. Follow these instructions at home: Medicines  Take medicines only as directed by your health care provider.  Apply an anti-itch or numbing cream to the affected area as directed by your health care provider. Blister and Rash Care  Take a cool bath or apply cool compresses to the area of the rash or blisters as directed by your health care provider. This may help with pain and itching.  Keep your rash covered with a loose bandage (dressing). Wear loose-fitting clothing to help ease the pain of material rubbing against the rash.  Keep your rash and blisters clean with mild soap and cool water or as  directed by your health care provider.  Check your rash every day for signs of infection. These include redness, swelling, and pain that lasts or increases.  Do not pick your blisters.  Do not scratch your rash. General instructions  Rest as directed by your health care provider.  Keep all follow-up visits as directed by your health care provider. This is important.  Until your blisters scab over, your infection can cause chickenpox in people who have never had it or been vaccinated against it. To prevent this from happening, avoid contact with other  people, especially: ? Babies. ? Pregnant women. ? Children who have eczema. ? Elderly people who have transplants. ? People who have chronic illnesses, such as leukemia or AIDS. Contact a health care provider if:  Your pain is not relieved with prescribed medicines.  Your pain does not get better after the rash heals.  Your rash looks infected. Signs of infection include redness, swelling, and pain that lasts or increases. Get help right away if:  The rash is on your face or nose.  You have facial pain, pain around your eye area, or loss of feeling on one side of your face.  You have ear pain or you have ringing in your ear.  You have loss of taste.  Your condition gets worse. This information is not intended to replace advice given to you by your health care provider. Make sure you discuss any questions you have with your health care provider. Document Released: 03/07/2005 Document Revised: 11/01/2015 Document Reviewed: 01/16/2014 Elsevier Interactive Patient Education  2017 [employer].

## 2016-12-21 ENCOUNTER — Other Ambulatory Visit: Payer: Self-pay | Admitting: Physician Assistant

## 2016-12-21 DIAGNOSIS — Z1231 Encounter for screening mammogram for malignant neoplasm of breast: Secondary | ICD-10-CM

## 2017-01-02 ENCOUNTER — Other Ambulatory Visit: Payer: Self-pay | Admitting: Physician Assistant

## 2017-01-02 DIAGNOSIS — J3089 Other allergic rhinitis: Secondary | ICD-10-CM

## 2017-01-05 ENCOUNTER — Encounter: Payer: BLUE CROSS/BLUE SHIELD | Admitting: Physician Assistant

## 2017-01-09 ENCOUNTER — Ambulatory Visit (INDEPENDENT_AMBULATORY_CARE_PROVIDER_SITE_OTHER): Payer: BLUE CROSS/BLUE SHIELD | Admitting: Physician Assistant

## 2017-01-09 ENCOUNTER — Encounter: Payer: Self-pay | Admitting: Physician Assistant

## 2017-01-09 VITALS — BP 120/90 | HR 80 | Temp 98.8°F | Resp 16 | Ht 64.0 in | Wt 181.0 lb

## 2017-01-09 DIAGNOSIS — E7849 Other hyperlipidemia: Secondary | ICD-10-CM | POA: Diagnosis not present

## 2017-01-09 DIAGNOSIS — R030 Elevated blood-pressure reading, without diagnosis of hypertension: Secondary | ICD-10-CM

## 2017-01-09 DIAGNOSIS — Z1239 Encounter for other screening for malignant neoplasm of breast: Secondary | ICD-10-CM

## 2017-01-09 DIAGNOSIS — Z1231 Encounter for screening mammogram for malignant neoplasm of breast: Secondary | ICD-10-CM

## 2017-01-09 DIAGNOSIS — Z114 Encounter for screening for human immunodeficiency virus [HIV]: Secondary | ICD-10-CM

## 2017-01-09 DIAGNOSIS — D508 Other iron deficiency anemias: Secondary | ICD-10-CM | POA: Diagnosis not present

## 2017-01-09 DIAGNOSIS — Z2821 Immunization not carried out because of patient refusal: Secondary | ICD-10-CM

## 2017-01-09 DIAGNOSIS — Z0000 Encounter for general adult medical examination without abnormal findings: Secondary | ICD-10-CM | POA: Diagnosis not present

## 2017-01-09 NOTE — Patient Instructions (Signed)

## 2017-01-09 NOTE — Progress Notes (Signed)
Patient: Monica Nelson, Female    DOB: 04/12/1970, 46 y.o.   MRN: [id number] Visit Date: 01/09/2017  Today's Provider: Mar Daring, PA-C   Chief Complaint  Patient presents with  . Annual Exam   Subjective:    Annual physical exam Monica Nelson is a 46 y.o. female who presents today for health maintenance and complete physical. She feels well. She reports exercising walking occasional. She reports she is sleeping well.  01/05/16 CPE 07/29/16 Pap-neg 01/26/16 Mammogram-BI-RADS 1 ---------------------------------------------------------------   Review of Systems  Constitutional: Negative.   HENT: Positive for sinus pressure and sneezing.   Eyes: Negative.   Respiratory: Negative.   Cardiovascular: Negative.   Gastrointestinal: Negative.   Endocrine: Negative.   Genitourinary: Negative.   Musculoskeletal: Negative.   Skin: Negative.   Allergic/Immunologic: Negative.   Neurological: Negative.   Hematological: Negative.   Psychiatric/Behavioral: Negative.     Social History      She  reports that she has never smoked. She has never used smokeless tobacco. She reports that she drinks alcohol. She reports that she does not use drugs.       Social History   Social History  . Marital status: Single    Spouse name: N/A  . Number of children: N/A  . Years of education: N/A   Occupational History  . Academic librarian Specialist    Social History Main Topics  . Smoking status: Never Smoker  . Smokeless tobacco: Never Used  . Alcohol use Yes     Comment: occasional  . Drug use: No  . Sexual activity: Yes    Birth control/ protection: IUD   Other Topics Concern  . None   Social History Narrative   Also works in Management consultant office    Past Medical History:  Diagnosis Date  . Allergic rhinitis   . Complex endometrial hyperplasia without atypia    currently treat with Mirena  . Hypertension      Patient Active Problem List   Diagnosis Date Noted  . Allergic rhinitis 01/05/2015  . Blood pressure decreased 01/05/2015  . Bradycardia, drug induced 01/05/2015  . Recurrent sinus infections 01/05/2015  . Dermatologic disease 09/07/2009  . Adiposity 01/29/2009  . Blood pressure elevated without history of HTN 08/14/2008  . Excess, menstruation 07/31/2008  . Anemia, iron deficiency 05/31/2007    Past Surgical History:  Procedure Laterality Date  . BREAST BIOPSY Left 02/04/2014 x 2   negative  . ENDOMETRIAL BIOPSY     May 2010 complex hyperplasia, Nov 2011 prolif endometrium, 2014 prolif  . HYSTEROSCOPY W/D&C  2010    Family History        Family Status  Relation Status  . Mother Alive  . Father Deceased at age 55       bone cancer  . Neg Hx (Not Specified)        Her family history includes Bone cancer in her father; Healthy in her mother; Thyroid disease in her mother.     No Known Allergies   Current Outpatient Prescriptions:  .  Ascorbic Acid (VITAMIN C) 1000 MG tablet, Take 1,000 mg by mouth daily. , Disp: , Rfl:  .  cetirizine (ZYRTEC) 10 MG tablet, Take 10 mg by mouth daily., Disp: , Rfl:  .  fluticasone (FLONASE) 50 MCG/ACT nasal spray, USE 2 SPRAYS EACH NOSTRIL EVERY DAY, Disp: 48 g, Rfl: 2 .  ibuprofen (ADVIL,MOTRIN) 800 MG tablet, Take 1 tablet (800 mg  total) by mouth 2 (two) times daily., Disp: 60 tablet, Rfl: 2 .  levonorgestrel (MIRENA, 52 MG,) 20 MCG/24HR IUD, MIRENA, 20MCG/24HR (Intrauterine Intrauterine Device)  for 0 days  Quantity: 0.00;  Refills: 0   Ordered :19-Nov-2009  Ashley Royalty ;  Started 29-Jun-2009 Active Comments: DX: 626.2, Disp: , Rfl:  .  Misc Natural Products (OSTEO BI-FLEX ADV JOINT SHIELD) TABS, Take 2 tablets by mouth daily. , Disp: , Rfl:    Patient Care Team: Mar Daring, PA-C as PCP - General (Family Medicine)      Objective:   Vitals: BP 120/90 (BP Location: Left Arm, Patient Position: Sitting, Cuff Size: Large)   Pulse 80   Temp 98.8 F (37.1 C)  (Oral)   Resp 16   Ht 5\' 4"  (1.626 m)   Wt 181 lb (82.1 kg)   SpO2 98%   BMI 31.07 kg/m    Vitals:   01/09/17 1404  BP: 120/90  Pulse: 80  Resp: 16  Temp: 98.8 F (37.1 C)  TempSrc: Oral  SpO2: 98%  Weight: 181 lb (82.1 kg)  Height: 5\' 4"  (1.626 m)     Physical Exam  Constitutional: She is oriented to person, place, and time. She appears well-developed and well-nourished. No distress.  HENT:  Head: Normocephalic and atraumatic.  Right Ear: Hearing, tympanic membrane, external ear and ear canal normal.  Left Ear: Hearing, tympanic membrane, external ear and ear canal normal.  Nose: Nose normal.  Mouth/Throat: Uvula is midline, oropharynx is clear and moist and mucous membranes are normal. No oropharyngeal exudate.  Eyes: Pupils are equal, round, and reactive to light. Conjunctivae and EOM are normal. Right eye exhibits no discharge. Left eye exhibits no discharge. No scleral icterus.  Neck: Normal range of motion. Neck supple. No JVD present. No tracheal deviation present. No thyromegaly present.  Cardiovascular: Normal rate, regular rhythm, normal heart sounds and intact distal pulses.  Exam reveals no gallop and no friction rub.   No murmur heard. Pulmonary/Chest: Effort normal and breath sounds normal. No respiratory distress. She has no wheezes. She has no rales. She exhibits no tenderness. Right breast exhibits no inverted nipple, no mass, no nipple discharge, no skin change and no tenderness. Left breast exhibits no inverted nipple, no mass, no nipple discharge, no skin change and no tenderness. Breasts are symmetrical.  Abdominal: Soft. Bowel sounds are normal. She exhibits no distension and no mass. There is no tenderness. There is no rebound and no guarding.  Musculoskeletal: Normal range of motion. She exhibits no edema or tenderness.  Lymphadenopathy:    She has no cervical adenopathy.  Neurological: She is alert and oriented to person, place, and time.  Skin: Skin is  warm and dry. No rash noted. She is not diaphoretic.  Psychiatric: She has a normal mood and affect. Her behavior is normal. Judgment and thought content normal.  Vitals reviewed.    Depression Screen PHQ 2/9 Scores 01/09/2017 01/05/2016 01/05/2015  PHQ - 2 Score 0 0 0  PHQ- 9 Score 0 - -      Assessment & Plan:     Routine Health Maintenance and Physical Exam  Exercise Activities and Dietary recommendations Goals    . Exercise 150 minutes per week (moderate activity)       Immunization History  Administered Date(s) Administered  . Influenza,inj,Quad PF,6+ Mos 03/07/2015  . Tdap 07/29/2005    Health Maintenance  Topic Date Due  . HIV Screening  01/04/1986  . TETANUS/TDAP  07/30/2015  . INFLUENZA VACCINE  10/19/2016  . PAP SMEAR  07/30/2019     Discussed health benefits of physical activity, and encouraged her to engage in regular exercise appropriate for her age and condition.    1. Annual physical exam Normal physical exam today. Will check labs as below and f/u pending lab results. If labs are stable and WNL she will not need to have these rechecked for one year at her next annual physical exam. She is to call the office in the meantime if she has any acute issue, questions or concerns. - CBC with Differential/Platelet - Comprehensive metabolic panel - TSH  2. Breast cancer screening Breast exam today was normal. There is no family history of breast cancer. She does perform regular self breast exams. Mammogram already scheduled for 02/01/17.  3. Other iron deficiency anemia Will check labs as below and f/u pending results. - CBC with Differential/Platelet  4. Blood pressure elevated without history of HTN Will check labs as below and f/u pending results. - Comprehensive metabolic panel  5. Other hyperlipidemia Will check labs as below and f/u pending results. - Lipid panel  6. Screening for HIV without presence of risk factors - HIV antibody (with  reflex)  7. Influenza vaccination declined  --------------------------------------------------------------------    Mar Daring, PA-C  [facility]

## 2017-01-10 ENCOUNTER — Telehealth: Payer: Self-pay

## 2017-01-10 LAB — CBC WITH DIFFERENTIAL/PLATELET
BASOS PCT: 0.5 %
Basophils Absolute: 49 cells/uL (ref 0–200)
EOS PCT: 2.8 %
Eosinophils Absolute: 272 cells/uL (ref 15–500)
HCT: 45.2 % — ABNORMAL HIGH (ref 35.0–45.0)
Hemoglobin: 15.1 g/dL (ref 11.7–15.5)
Lymphs Abs: 1649 cells/uL (ref 850–3900)
MCH: 29.3 pg (ref 27.0–33.0)
MCHC: 33.4 g/dL (ref 32.0–36.0)
MCV: 87.6 fL (ref 80.0–100.0)
MONOS PCT: 5.4 %
MPV: 10.8 fL (ref 7.5–12.5)
NEUTROS PCT: 74.3 %
Neutro Abs: 7207 cells/uL (ref 1500–7800)
PLATELETS: 285 10*3/uL (ref 140–400)
RBC: 5.16 10*6/uL — AB (ref 3.80–5.10)
RDW: 12.2 % (ref 11.0–15.0)
TOTAL LYMPHOCYTE: 17 %
WBC: 9.7 10*3/uL (ref 3.8–10.8)
WBCMIX: 524 {cells}/uL (ref 200–950)

## 2017-01-10 LAB — COMPLETE METABOLIC PANEL WITH GFR
AG RATIO: 1.7 (calc) (ref 1.0–2.5)
ALT: 11 U/L (ref 6–29)
AST: 16 U/L (ref 10–35)
Albumin: 4.5 g/dL (ref 3.6–5.1)
Alkaline phosphatase (APISO): 62 U/L (ref 33–115)
BILIRUBIN TOTAL: 0.9 mg/dL (ref 0.2–1.2)
BUN: 13 mg/dL (ref 7–25)
CALCIUM: 9.6 mg/dL (ref 8.6–10.2)
CO2: 30 mmol/L (ref 20–32)
Chloride: 102 mmol/L (ref 98–110)
Creat: 0.83 mg/dL (ref 0.50–1.10)
GFR, EST NON AFRICAN AMERICAN: 85 mL/min/{1.73_m2} (ref >=60)
GFR, Est African American: 98 mL/min/{1.73_m2} (ref >=60)
Globulin: 2.6 g/dL (calc) (ref 1.9–3.7)
Glucose, Bld: 91 mg/dL (ref 65–99)
Potassium: 4.2 mmol/L (ref 3.5–5.3)
SODIUM: 138 mmol/L (ref 135–146)
TOTAL PROTEIN: 7.1 g/dL (ref 6.1–8.1)

## 2017-01-10 LAB — LIPID PANEL
CHOLESTEROL: 188 mg/dL (ref <200)
HDL: 71 mg/dL (ref >50)
LDL CHOLESTEROL (CALC): 103 mg/dL — AB
Non-HDL Cholesterol (Calc): 117 mg/dL (calc) (ref <130)
TRIGLYCERIDES: 58 mg/dL (ref <150)
Total CHOL/HDL Ratio: 2.6 (calc) (ref <5.0)

## 2017-01-10 LAB — TSH: TSH: 3.4 mIU/L

## 2017-01-10 LAB — HIV ANTIBODY (ROUTINE TESTING W REFLEX): HIV: NONREACTIVE

## 2017-01-10 NOTE — Telephone Encounter (Signed)
-----   Message from Mar Daring, [place] sent at 01/10/2017  9:02 AM EDT ----- All labs are within normal limits and stable.  Thanks! -JB

## 2017-01-10 NOTE — Telephone Encounter (Signed)
LMTCB  Thanks,  -Joseline 

## 2017-01-11 NOTE — Telephone Encounter (Signed)
Patient advised as below.  

## 2017-01-11 NOTE — Telephone Encounter (Signed)
lmtcb

## 2017-01-31 ENCOUNTER — Ambulatory Visit
Admission: RE | Admit: 2017-01-31 | Discharge: 2017-01-31 | Disposition: A | Discharge status: Home / Self Care | Payer: BLUE CROSS/BLUE SHIELD | Source: Ambulatory Visit | Attending: Physician Assistant | Admitting: Physician Assistant

## 2017-01-31 DIAGNOSIS — Z1231 Encounter for screening mammogram for malignant neoplasm of breast: Secondary | ICD-10-CM | POA: Diagnosis not present

## 2017-06-08 ENCOUNTER — Encounter: Payer: Self-pay | Admitting: Physician Assistant

## 2017-06-08 ENCOUNTER — Ambulatory Visit: Payer: BLUE CROSS/BLUE SHIELD | Admitting: Physician Assistant

## 2017-06-08 VITALS — BP 138/90 | HR 82 | Temp 98.3°F | Resp 16 | Wt 182.5 lb

## 2017-06-08 DIAGNOSIS — R05 Cough: Secondary | ICD-10-CM

## 2017-06-08 DIAGNOSIS — R059 Cough, unspecified: Secondary | ICD-10-CM

## 2017-06-08 DIAGNOSIS — J0140 Acute pansinusitis, unspecified: Secondary | ICD-10-CM | POA: Diagnosis not present

## 2017-06-08 MED ORDER — HYDROCODONE-HOMATROPINE 5-1.5 MG/5ML PO SYRP: 5.0000 mL | ORAL_SOLUTION | Freq: Three times a day (TID) | ORAL | 0 refills | Status: DC | PRN

## 2017-06-08 MED ORDER — AMOXICILLIN-POT CLAVULANATE 875-125 MG PO TABS: 1.0000 | ORAL_TABLET | Freq: Two times a day (BID) | ORAL | 0 refills | Status: DC

## 2017-06-08 NOTE — Progress Notes (Signed)
Patient: Monica Nelson Female    DOB: 17-Jan-1971   47 y.o.   MRN: [id number] Visit Date: 06/08/2017  Today's Provider: Mar Daring, PA-C   Chief Complaint  Patient presents with  . Sinusitis   Subjective:    Sinusitis  This is a new problem. The current episode started 1 to 4 weeks ago (2 weeks). The problem has been gradually worsening since onset. There has been no fever. Associated symptoms include chills, congestion, coughing, ear pain, headaches, a hoarse voice, sinus pressure and sneezing. Pertinent negatives include no shortness of breath or sore throat. Past treatments include oral decongestants (Mucinex D-Sinus). The treatment provided no relief.      No Known Allergies   Current Outpatient Medications:  .  Ascorbic Acid (VITAMIN C) 1000 MG tablet, Take 1,000 mg by mouth daily. , Disp: , Rfl:  .  cetirizine (ZYRTEC) 10 MG tablet, Take 10 mg by mouth daily., Disp: , Rfl:  .  fluticasone (FLONASE) 50 MCG/ACT nasal spray, USE 2 SPRAYS EACH NOSTRIL EVERY DAY, Disp: 48 g, Rfl: 2 .  ibuprofen (ADVIL,MOTRIN) 800 MG tablet, Take 1 tablet (800 mg total) by mouth 2 (two) times daily., Disp: 60 tablet, Rfl: 2 .  levonorgestrel (MIRENA, 52 MG,) 20 MCG/24HR IUD, MIRENA, 20MCG/24HR (Intrauterine Intrauterine Device)  for 0 days  Quantity: 0.00;  Refills: 0   Ordered :19-Nov-2009  Ashley Royalty ;  Started 29-Jun-2009 Active Comments: DX: 626.2, Disp: , Rfl:  .  Misc Natural Products (OSTEO BI-FLEX ADV JOINT SHIELD) TABS, Take 2 tablets by mouth daily. , Disp: , Rfl:   Review of Systems  Constitutional: Positive for chills. Negative for fever.  HENT: Positive for congestion, ear pain, hoarse voice, postnasal drip, rhinorrhea, sinus pressure, sinus pain, sneezing and voice change. Negative for sore throat and trouble swallowing.   Respiratory: Positive for cough. Negative for chest tightness, shortness of breath and wheezing.   Cardiovascular: Negative for chest pain,  palpitations and leg swelling.  Neurological: Positive for headaches.    Social History   Tobacco Use  . Smoking status: Never Smoker  . Smokeless tobacco: Never Used  Substance Use Topics  . Alcohol use: Yes    Comment: occasional   Objective:   BP 138/90 (BP Location: Left Arm, Patient Position: Sitting, Cuff Size: Normal)   Pulse 82   Temp 98.3 F (36.8 C) (Oral)   Resp 16   Wt 182 lb 8 oz (82.8 kg)   SpO2 99%   BMI 31.33 kg/m  Vitals:   06/08/17 1039  BP: 138/90  Pulse: 82  Resp: 16  Temp: 98.3 F (36.8 C)  TempSrc: Oral  SpO2: 99%  Weight: 182 lb 8 oz (82.8 kg)     Physical Exam  Constitutional: She appears well-developed and well-nourished. No distress.  HENT:  Head: Normocephalic and atraumatic.  Right Ear: Hearing, tympanic membrane, external ear and ear canal normal.  Left Ear: Hearing, tympanic membrane, external ear and ear canal normal.  Nose: Right sinus exhibits maxillary sinus tenderness and frontal sinus tenderness. Left sinus exhibits maxillary sinus tenderness and frontal sinus tenderness.  Mouth/Throat: Uvula is midline, oropharynx is clear and moist and mucous membranes are normal. No oropharyngeal exudate.  Neck: Normal range of motion. Neck supple. No tracheal deviation present. No thyromegaly present.  Cardiovascular: Normal rate, regular rhythm and normal heart sounds. Exam reveals no gallop and no friction rub.  No murmur heard. Pulmonary/Chest: Effort normal and breath sounds  normal. No stridor. No respiratory distress. She has no wheezes. She has no rales.  Lymphadenopathy:    She has no cervical adenopathy.  Skin: She is not diaphoretic.  Vitals reviewed.      Assessment & Plan:     1. Acute pansinusitis, recurrence not specified Worsening symptoms that have not responded to OTC medications. Will give augmentin as below. Continue allergy medications. Stay well hydrated and get plenty of rest. Call if no symptom improvement or if  symptoms worsen. - amoxicillin-clavulanate (AUGMENTIN) 875-125 MG tablet; Take 1 tablet by mouth 2 (two) times daily.  Dispense: 20 tablet; Refill: 0  2. Cough Worsening symptoms that has not responded to OTC medications. Will give Hycodan cough syrup as below for nighttime cough. Drowsiness precautions given to patient. Stay well hydrated. Use delsym, robitussin OR mucinex for daytime cough. - HYDROcodone-homatropine (HYCODAN) 5-1.5 MG/5ML syrup; Take 5 mLs by mouth every 8 (eight) hours as needed for cough.  Dispense: 120 mL; Refill: 0       Mar Daring, PA-C  [facility]

## 2017-06-19 ENCOUNTER — Telehealth: Payer: Self-pay | Admitting: Physician Assistant

## 2017-06-19 DIAGNOSIS — J0140 Acute pansinusitis, unspecified: Secondary | ICD-10-CM

## 2017-06-19 MED ORDER — AMOXICILLIN-POT CLAVULANATE 875-125 MG PO TABS: 1.0000 | ORAL_TABLET | Freq: Two times a day (BID) | ORAL | 0 refills | Status: DC

## 2017-06-19 NOTE — Telephone Encounter (Signed)
Patient reports ear feels like there is still fluid in her ear.

## 2017-06-19 NOTE — Telephone Encounter (Signed)
Patient advised as below.  

## 2017-06-19 NOTE — Telephone Encounter (Signed)
Sent in

## 2017-06-19 NOTE — Telephone Encounter (Signed)
Please Review.  Thanks,  -Lynnzie Blackson 

## 2017-06-19 NOTE — Telephone Encounter (Signed)
Pt states she isn't feeling any better and she is requesting another round of antibiotic.  Pt uses [facility] on [address]

## 2017-09-01 ENCOUNTER — Ambulatory Visit: Payer: Self-pay | Admitting: Physician Assistant

## 2017-12-01 ENCOUNTER — Other Ambulatory Visit: Payer: Self-pay | Admitting: Physician Assistant

## 2017-12-01 DIAGNOSIS — J3089 Other allergic rhinitis: Secondary | ICD-10-CM

## 2018-01-08 ENCOUNTER — Other Ambulatory Visit: Payer: Self-pay | Admitting: Physician Assistant

## 2018-01-08 DIAGNOSIS — Z1231 Encounter for screening mammogram for malignant neoplasm of breast: Secondary | ICD-10-CM

## 2018-01-25 ENCOUNTER — Encounter: Payer: BLUE CROSS/BLUE SHIELD | Admitting: Physician Assistant

## 2018-01-25 ENCOUNTER — Encounter: Payer: Self-pay | Admitting: Physician Assistant

## 2018-02-07 ENCOUNTER — Ambulatory Visit (INDEPENDENT_AMBULATORY_CARE_PROVIDER_SITE_OTHER): Payer: BLUE CROSS/BLUE SHIELD | Admitting: Physician Assistant

## 2018-02-07 ENCOUNTER — Telehealth: Payer: Self-pay

## 2018-02-07 ENCOUNTER — Ambulatory Visit
Admission: RE | Admit: 2018-02-07 | Discharge: 2018-02-07 | Disposition: A | Discharge status: Home / Self Care | Payer: BLUE CROSS/BLUE SHIELD | Source: Ambulatory Visit | Attending: Physician Assistant | Admitting: Physician Assistant

## 2018-02-07 ENCOUNTER — Encounter: Payer: Self-pay | Admitting: Physician Assistant

## 2018-02-07 VITALS — BP 128/80 | HR 56 | Temp 98.0°F | Resp 16 | Wt 193.2 lb

## 2018-02-07 DIAGNOSIS — Z23 Encounter for immunization: Secondary | ICD-10-CM

## 2018-02-07 DIAGNOSIS — E6609 Other obesity due to excess calories: Secondary | ICD-10-CM

## 2018-02-07 DIAGNOSIS — E7849 Other hyperlipidemia: Secondary | ICD-10-CM

## 2018-02-07 DIAGNOSIS — Z1231 Encounter for screening mammogram for malignant neoplasm of breast: Secondary | ICD-10-CM | POA: Diagnosis not present

## 2018-02-07 DIAGNOSIS — Z0000 Encounter for general adult medical examination without abnormal findings: Secondary | ICD-10-CM

## 2018-02-07 DIAGNOSIS — Z6833 Body mass index (BMI) 33.0-33.9, adult: Secondary | ICD-10-CM

## 2018-02-07 NOTE — Patient Instructions (Signed)

## 2018-02-07 NOTE — Progress Notes (Signed)
Patient: Monica Nelson, Female    DOB: May 04, 1970, 47 y.o.   MRN: [id number] Visit Date: 02/07/2018  Today's Provider: Mar Daring, PA-C   Chief Complaint  Patient presents with  . Annual Exam   Subjective:    Annual physical exam Monica Nelson is a 47 y.o. female who presents today for health maintenance and complete physical. She feels well. She reports exercising some. She reports she is sleeping well.  Last CPE:01/09/17 Pap:07/29/16-Normal with GYN Mammogram:Scheduled today. -----------------------------------------------------------------   Review of Systems  Constitutional: Negative.   HENT: Positive for congestion ("seasonal").   Eyes: Negative.   Respiratory: Negative.   Cardiovascular: Negative.   Gastrointestinal: Negative.   Endocrine: Negative.   Genitourinary: Negative.   Musculoskeletal: Negative.   Skin: Negative.   Allergic/Immunologic: Negative.   Neurological: Negative.   Hematological: Negative.   Psychiatric/Behavioral: Negative.     Social History      She  reports that she has never smoked. She has never used smokeless tobacco. She reports that she drinks alcohol. She reports that she does not use drugs.       Social History   Socioeconomic History  . Marital status: Single    Spouse name: Not on file  . Number of children: Not on file  . Years of education: Not on file  . Highest education level: Not on file  Occupational History  . Occupation: Development worker, international aid  Social Needs  . Financial resource strain: Not on file  . Food insecurity:    Worry: Not on file    Inability: Not on file  . Transportation needs:    Medical: Not on file    Non-medical: Not on file  Tobacco Use  . Smoking status: Never Smoker  . Smokeless tobacco: Never Used  Substance and Sexual Activity  . Alcohol use: Yes    Comment: occasional  . Drug use: No  . Sexual activity: Yes    Birth control/protection: IUD  Lifestyle    . Physical activity:    Days per week: Not on file    Minutes per session: Not on file  . Stress: Not on file  Relationships  . Social connections:    Talks on phone: Not on file    Gets together: Not on file    Attends religious service: Not on file    Active member of club or organization: Not on file    Attends meetings of clubs or organizations: Not on file    Relationship status: Not on file  Other Topics Concern  . Not on file  Social History Narrative   Also works in Management consultant office    Past Medical History:  Diagnosis Date  . Allergic rhinitis   . Complex endometrial hyperplasia without atypia    currently treat with Mirena  . Hypertension      Patient Active Problem List   Diagnosis Date Noted  . Allergic rhinitis 01/05/2015  . Blood pressure decreased 01/05/2015  . Bradycardia, drug induced 01/05/2015  . Recurrent sinus infections 01/05/2015  . Dermatologic disease 09/07/2009  . Adiposity 01/29/2009  . Blood pressure elevated without history of HTN 08/14/2008  . Excess, menstruation 07/31/2008  . Anemia, iron deficiency 05/31/2007    Past Surgical History:  Procedure Laterality Date  . BREAST BIOPSY Left 02/04/2014 x 2   negative  . ENDOMETRIAL BIOPSY     Monica 2010 complex hyperplasia, Nov 2011 prolif endometrium, 2014 prolif  .  HYSTEROSCOPY W/D&C  2010    Family History        Family Status  Relation Name Status  . Mother  Alive  . Father  Deceased at age 71       bone cancer  . Neg Hx  (Not Specified)        Her family history includes Bone cancer in her father; Healthy in her mother; Thyroid disease in her mother. There is no history of Breast cancer.      No Known Allergies   Current Outpatient Medications:  .  amoxicillin-clavulanate (AUGMENTIN) 875-125 MG tablet, Take 1 tablet by mouth 2 (two) times daily., Disp: 20 tablet, Rfl: 0 .  Ascorbic Acid (VITAMIN C) 1000 MG tablet, Take 1,000 mg by mouth daily. , Disp: , Rfl:  .   cetirizine (ZYRTEC) 10 MG tablet, Take 10 mg by mouth daily., Disp: , Rfl:  .  fluticasone (FLONASE) 50 MCG/ACT nasal spray, USE 2 SPRAYS EACH NOSTRIL EVERY DAY, Disp: 16 g, Rfl: 2 .  HYDROcodone-homatropine (HYCODAN) 5-1.5 MG/5ML syrup, Take 5 mLs by mouth every 8 (eight) hours as needed for cough., Disp: 120 mL, Rfl: 0 .  ibuprofen (ADVIL,MOTRIN) 800 MG tablet, Take 1 tablet (800 mg total) by mouth 2 (two) times daily., Disp: 60 tablet, Rfl: 2 .  levonorgestrel (MIRENA, 52 MG,) 20 MCG/24HR IUD, MIRENA, 20MCG/24HR (Intrauterine Intrauterine Device)  for 0 days  Quantity: 0.00;  Refills: 0   Ordered :19-Nov-2009  Ashley Royalty ;  Started 29-Jun-2009 Active Comments: DX: 626.2, Disp: , Rfl:  .  Misc Natural Products (OSTEO BI-FLEX ADV JOINT SHIELD) TABS, Take 2 tablets by mouth daily. , Disp: , Rfl:    Patient Care Team: Mar Daring, PA-C as PCP - General (Family Medicine)      Objective:   Vitals: BP 128/80 (BP Location: Left Arm, Patient Position: Sitting, Cuff Size: Normal)   Pulse (!) 56   Temp 98 F (36.7 C) (Oral)   Resp 16   Wt 193 lb 3.2 oz (87.6 kg)   SpO2 97%   BMI 33.16 kg/m    Vitals:   02/07/18 0836  BP: 128/80  Pulse: (!) 56  Resp: 16  Temp: 98 F (36.7 C)  TempSrc: Oral  SpO2: 97%  Weight: 193 lb 3.2 oz (87.6 kg)     Physical Exam  Constitutional: She is oriented to person, place, and time. She appears well-developed and well-nourished. No distress.  HENT:  Head: Normocephalic and atraumatic.  Right Ear: Hearing, tympanic membrane, external ear and ear canal normal.  Left Ear: Hearing, tympanic membrane, external ear and ear canal normal.  Nose: Nose normal.  Mouth/Throat: Uvula is midline, oropharynx is clear and moist and mucous membranes are normal. No oropharyngeal exudate.  Eyes: Pupils are equal, round, and reactive to light. Conjunctivae and EOM are normal. Right eye exhibits no discharge. Left eye exhibits no discharge. No scleral icterus.    Neck: Normal range of motion. Neck supple. No JVD present. No tracheal deviation present. No thyromegaly present.  Cardiovascular: Regular rhythm and intact distal pulses. Bradycardia present. Exam reveals no gallop and no friction rub.  Murmur heard.  Systolic murmur is present with a grade of 2/6. Pulmonary/Chest: Effort normal and breath sounds normal. No respiratory distress. She has no wheezes. She has no rales. She exhibits no tenderness. Right breast exhibits no inverted nipple, no mass, no nipple discharge, no skin change and no tenderness. Left breast exhibits no inverted nipple, no mass,  no nipple discharge, no skin change and no tenderness. No breast swelling, tenderness, discharge or bleeding. Breasts are symmetrical.  Abdominal: Soft. Bowel sounds are normal. She exhibits no distension and no mass. There is no tenderness. There is no rebound and no guarding.  Musculoskeletal: Normal range of motion. She exhibits no edema or tenderness.  Lymphadenopathy:    She has no cervical adenopathy.  Neurological: She is alert and oriented to person, place, and time.  Skin: Skin is warm and dry. No rash noted. She is not diaphoretic.  Psychiatric: She has a normal mood and affect. Her behavior is normal. Judgment and thought content normal.  Vitals reviewed.    Depression Screen PHQ 2/9 Scores 02/07/2018 01/09/2017 01/05/2016 01/05/2015  PHQ - 2 Score 0 0 0 0  PHQ- 9 Score - 0 - -      Assessment & Plan:     Routine Health Maintenance and Physical Exam  Exercise Activities and Dietary recommendations Goals    . Exercise 150 minutes per week (moderate activity)       Immunization History  Administered Date(s) Administered  . Influenza,inj,Quad PF,6+ Mos 03/07/2015  . Tdap 07/29/2005    Health Maintenance  Topic Date Due  . TETANUS/TDAP  07/30/2015  . INFLUENZA VACCINE  10/19/2017  . PAP SMEAR  07/30/2019  . HIV Screening  Completed     Discussed health benefits of  physical activity, and encouraged her to engage in regular exercise appropriate for her age and condition.    1. Annual physical exam Normal physical exam today. Will check labs as below and f/u pending lab results. If labs are stable and WNL she will not need to have these rechecked for one year at her next annual physical exam. She is to call the office in the meantime if she has any acute issue, questions or concerns. - CBC with Differential/Platelet - Comprehensive metabolic panel - Hemoglobin A1c - TSH  2. Other hyperlipidemia Diet controlled. Will check labs as below and f/u pending results. - Lipid panel  3. Class 1 obesity due to excess calories without serious comorbidity with body mass index (BMI) of 33.0 to 33.9 in adult Counseled patient on healthy lifestyle modifications including dieting and exercise.   4. Need for influenza vaccination Flu vaccine given today without complication. Patient sat upright for 15 minutes to check for adverse reaction before being released. - Flu Vaccine QUAD 36+ mos IM  --------------------------------------------------------------------    Mar Daring, PA-C  [facility]

## 2018-02-07 NOTE — Telephone Encounter (Signed)
LMTCB or to view results through my chart 

## 2018-02-07 NOTE — Telephone Encounter (Signed)
-----   Message from Mar Daring, [place] sent at 02/07/2018  1:28 PM EST ----- Normal mammogram. Repeat screening in one year.

## 2018-02-08 LAB — COMPREHENSIVE METABOLIC PANEL
ALT: 15 IU/L (ref 0–32)
AST: 15 IU/L (ref 0–40)
Albumin/Globulin Ratio: 1.8 (ref 1.2–2.2)
Albumin: 4.4 g/dL (ref 3.5–5.5)
Alkaline Phosphatase: 65 IU/L (ref 39–117)
BUN/Creatinine Ratio: 30 — ABNORMAL HIGH (ref 9–23)
BUN: 19 mg/dL (ref 6–24)
Bilirubin Total: 0.6 mg/dL (ref 0.0–1.2)
CALCIUM: 9.7 mg/dL (ref 8.7–10.2)
CO2: 25 mmol/L (ref 20–29)
CREATININE: 0.63 mg/dL (ref 0.57–1.00)
Chloride: 101 mmol/L (ref 96–106)
GFR calc Af Amer: 124 mL/min/{1.73_m2} (ref >59)
GFR, EST NON AFRICAN AMERICAN: 107 mL/min/{1.73_m2} (ref >59)
GLOBULIN, TOTAL: 2.4 g/dL (ref 1.5–4.5)
Glucose: 91 mg/dL (ref 65–99)
Potassium: 4.4 mmol/L (ref 3.5–5.2)
SODIUM: 140 mmol/L (ref 134–144)
Total Protein: 6.8 g/dL (ref 6.0–8.5)

## 2018-02-08 LAB — CBC WITH DIFFERENTIAL/PLATELET
Basophils Absolute: 0.1 10*3/uL (ref 0.0–0.2)
Basos: 1 %
EOS (ABSOLUTE): 0.4 10*3/uL (ref 0.0–0.4)
Eos: 5 %
HEMATOCRIT: 43.4 % (ref 34.0–46.6)
HEMOGLOBIN: 14.1 g/dL (ref 11.1–15.9)
IMMATURE GRANULOCYTES: 0 %
Immature Grans (Abs): 0 10*3/uL (ref 0.0–0.1)
Lymphocytes Absolute: 1.7 10*3/uL (ref 0.7–3.1)
Lymphs: 26 %
MCH: 29 pg (ref 26.6–33.0)
MCHC: 32.5 g/dL (ref 31.5–35.7)
MCV: 89 fL (ref 79–97)
MONOCYTES: 8 %
MONOS ABS: 0.5 10*3/uL (ref 0.1–0.9)
NEUTROS PCT: 60 %
Neutrophils Absolute: 4 10*3/uL (ref 1.4–7.0)
Platelets: 304 10*3/uL (ref 150–450)
RBC: 4.87 x10E6/uL (ref 3.77–5.28)
RDW: 12.7 % (ref 12.3–15.4)
WBC: 6.6 10*3/uL (ref 3.4–10.8)

## 2018-02-08 LAB — HEMOGLOBIN A1C
Est. average glucose Bld gHb Est-mCnc: 108 mg/dL
Hgb A1c MFr Bld: 5.4 % (ref 4.8–5.6)

## 2018-02-08 LAB — LIPID PANEL
CHOLESTEROL TOTAL: 181 mg/dL (ref 100–199)
Chol/HDL Ratio: 3.4 ratio (ref 0.0–4.4)
HDL: 54 mg/dL (ref >39)
LDL Calculated: 116 mg/dL — ABNORMAL HIGH (ref 0–99)
TRIGLYCERIDES: 54 mg/dL (ref 0–149)
VLDL CHOLESTEROL CAL: 11 mg/dL (ref 5–40)

## 2018-02-08 LAB — TSH: TSH: 2.19 u[IU]/mL (ref 0.450–4.500)

## 2018-02-09 ENCOUNTER — Telehealth: Payer: Self-pay

## 2018-02-09 NOTE — Telephone Encounter (Signed)
LMTCB or to view results through my chart 

## 2018-02-09 NOTE — Telephone Encounter (Signed)
Patient advised as directed below. 

## 2018-02-09 NOTE — Telephone Encounter (Signed)
-----   Message from Mar Daring, PA-C sent at 02/09/2018  8:22 AM EST ----- All labs are within normal limits and stable.  Thanks! -JB

## 2018-03-25 ENCOUNTER — Other Ambulatory Visit: Payer: Self-pay | Admitting: Physician Assistant

## 2018-03-25 DIAGNOSIS — J3089 Other allergic rhinitis: Secondary | ICD-10-CM

## 2018-05-07 ENCOUNTER — Encounter: Payer: Self-pay | Admitting: Physician Assistant

## 2018-05-07 ENCOUNTER — Ambulatory Visit
Admission: RE | Admit: 2018-05-07 | Discharge: 2018-05-07 | Disposition: A | Discharge status: Home / Self Care | Payer: BLUE CROSS/BLUE SHIELD | Attending: Physician Assistant | Admitting: Physician Assistant

## 2018-05-07 ENCOUNTER — Ambulatory Visit: Payer: BLUE CROSS/BLUE SHIELD | Admitting: Physician Assistant

## 2018-05-07 ENCOUNTER — Telehealth: Payer: Self-pay

## 2018-05-07 ENCOUNTER — Ambulatory Visit
Admission: RE | Admit: 2018-05-07 | Discharge: 2018-05-07 | Disposition: A | Discharge status: Home / Self Care | Payer: BLUE CROSS/BLUE SHIELD | Source: Ambulatory Visit | Attending: Physician Assistant | Admitting: Physician Assistant

## 2018-05-07 VITALS — BP 142/88 | HR 76 | Temp 98.6°F | Resp 16 | Wt 195.4 lb

## 2018-05-07 DIAGNOSIS — W5501XA Bitten by cat, initial encounter: Secondary | ICD-10-CM

## 2018-05-07 DIAGNOSIS — M7989 Other specified soft tissue disorders: Secondary | ICD-10-CM | POA: Diagnosis not present

## 2018-05-07 DIAGNOSIS — S61451A Open bite of right hand, initial encounter: Secondary | ICD-10-CM | POA: Diagnosis not present

## 2018-05-07 DIAGNOSIS — Z23 Encounter for immunization: Secondary | ICD-10-CM

## 2018-05-07 MED ORDER — AMOXICILLIN-POT CLAVULANATE 875-125 MG PO TABS: 1.0000 | ORAL_TABLET | Freq: Two times a day (BID) | ORAL | 0 refills | Status: DC

## 2018-05-07 NOTE — Telephone Encounter (Signed)
-----   Message from Mar Daring, [place] sent at 05/07/2018  2:33 PM EST ----- Hand xray normal with exception of soft tissue swelling.

## 2018-05-07 NOTE — Telephone Encounter (Signed)
Patient advised as directed below. 

## 2018-05-07 NOTE — Progress Notes (Signed)
Patient: Monica Nelson Female    DOB: 06/08/1970   48 y.o.   MRN: [id number] Visit Date: 05/07/2018  Today's Provider: Mar Daring, PA-C   Chief Complaint  Patient presents with  . Hand Swelling   Subjective:     HPI  Patient here today with c/o right hand swollen. Patient noticed the swelling this morning. She reports that she works at the [employer] and got bitten on Saturday morning by a cat while cutting his nails. She reports she didn't bleed or feel any pain. She reports that the cat is healthy. Her hand looks red and swollen.She also got injured at her other work on Friday, she hit her right hand on the metal conveyor.  No Known Allergies   Current Outpatient Medications:  .  Ascorbic Acid (VITAMIN C) 1000 MG tablet, Take 1,000 mg by mouth daily. , Disp: , Rfl:  .  cetirizine (ZYRTEC) 10 MG tablet, Take 10 mg by mouth daily., Disp: , Rfl:  .  fluticasone (FLONASE) 50 MCG/ACT nasal spray, USE 2 SPRAYS IN EACH NOSTRIL EVERY DAY, Disp: 48 g, Rfl: 0 .  ibuprofen (ADVIL,MOTRIN) 800 MG tablet, Take 1 tablet (800 mg total) by mouth 2 (two) times daily., Disp: 60 tablet, Rfl: 2 .  levonorgestrel (MIRENA, 52 MG,) 20 MCG/24HR IUD, MIRENA, 20MCG/24HR (Intrauterine Intrauterine Device)  for 0 days  Quantity: 0.00;  Refills: 0   Ordered :19-Nov-2009  Ashley Royalty ;  Started 29-Jun-2009 Active Comments: DX: 626.2, Disp: , Rfl:  .  Misc Natural Products (OSTEO BI-FLEX ADV JOINT SHIELD) TABS, Take 2 tablets by mouth daily. , Disp: , Rfl:   Review of Systems  Constitutional: Negative.   Respiratory: Negative.   Cardiovascular: Negative.   Gastrointestinal: Negative.   Musculoskeletal: Positive for arthralgias and joint swelling.  Neurological: Negative.     Social History   Tobacco Use  . Smoking status: Never Smoker  . Smokeless tobacco: Never Used  Substance Use Topics  . Alcohol use: Yes    Comment: occasional      Objective:   BP (!) 142/88 (BP  Location: Left Arm, Patient Position: Sitting, Cuff Size: Large)   Pulse 76   Temp 98.6 F (37 C) (Oral)   Resp 16   Wt 195 lb 6.4 oz (88.6 kg)   SpO2 98%   BMI 33.54 kg/m  Vitals:   05/07/18 0836  BP: (!) 142/88  Pulse: 76  Resp: 16  Temp: 98.6 F (37 C)  TempSrc: Oral  SpO2: 98%  Weight: 195 lb 6.4 oz (88.6 kg)     Physical Exam Vitals signs reviewed.  Constitutional:      General: She is not in acute distress.    Appearance: Normal appearance. She is well-developed. She is not diaphoretic.  Neck:     Musculoskeletal: Normal range of motion and neck supple.  Cardiovascular:     Rate and Rhythm: Normal rate and regular rhythm.     Heart sounds: Normal heart sounds. No murmur. No friction rub. No gallop.   Pulmonary:     Effort: Pulmonary effort is normal. No respiratory distress.     Breath sounds: Normal breath sounds. No wheezing or rales.  Musculoskeletal:     Right hand: She exhibits decreased range of motion, tenderness and swelling. She exhibits normal capillary refill.     Comments: See photos of right hand below.  Skin:    General: Skin is warm.  Findings: Erythema present.  Neurological:     Mental Status: She is alert.             Assessment & Plan    1. Cat bite of right hand, initial encounter Tetanus boster given today to update. Will get imaging as below to r/o any bony involvement. I will f/u pending results. Augmentin given as below. Ice. Discussed using a sling to keep hand elevated. I will see her back in 2-4 days for re-evaluation.  - Td : Tetanus/diphtheria >7yo Preservative  free - DG Hand Complete Right; Future - amoxicillin-clavulanate (AUGMENTIN) 875-125 MG tablet; Take 1 tablet by mouth 2 (two) times daily.  Dispense: 20 tablet; Refill: 0     Mar Daring, PA-C  [facility]

## 2018-05-07 NOTE — Patient Instructions (Signed)
Animal Bite, Adult Animal bites range from mild to serious. An animal bite can result in any of these injuries:  A scratch.  A deep, open cut.  A puncture of the skin.  A crush injury.  Tearing away of the skin or a body part.  A bone injury. A small bite from a house pet is usually less serious than a bite from a stray or wild animal, such as a raccoon, fox, skunk, or bat. That is because stray and wild animals have a higher risk of carrying a serious infection called rabies, which can be passed to humans through a bite. What increases the risk? You are more likely to be bitten by an animal if:  You are around unfamiliar pets.  You disturb an animal when it is eating, sleeping, or caring for its babies.  You are outdoors in a place where small, wild animals roam freely. What are the signs or symptoms? Common symptoms of an animal bite include:  Pain.  Bleeding.  Swelling.  Bruising. How is this diagnosed? This condition may be diagnosed based on a physical exam and medical history. Your health care provider will examine your wound and ask for details about the animal and how the bite happened. You may also have tests, such as:  Blood tests to check for infection.  X-rays to check for damage to bones or joints.  Taking a fluid sample from your wound and checking it for infection (culture test). How is this treated? Treatment varies depending on the type of animal, where the bite is on your body, and your medical history. Treatment may include:  Caring for the wound. This often includes cleaning the wound, rinsing out (flushing) the wound with saline solution, and applying a bandage (dressing). In some cases, the wound may be closed with stitches (sutures), staples, skin glue, or adhesive strips.  Antibiotic medicine to prevent or treat infection. This medicine may be prescribed in pill or ointment form. If the bite area becomes infected, the medicine may be given through  an IV.  A tetanus shot to prevent tetanus infection.  Rabies treatment to prevent rabies infection. This will be done if the animal could have rabies.  Surgery. This may be done if a bite gets infected or if there is damage that needs to be repaired. Follow these instructions at home: Wound care   Follow instructions from your health care provider about how to take care of your wound. Make sure you: ? Wash your hands with soap and water before you change your dressing. If soap and water are not available, use hand sanitizer. ? Change your dressing as told by your health care provider. ? Leave sutures, skin glue, or adhesive strips in place. These skin closures may need to stay in place for 2 weeks or longer. If adhesive strip edges start to loosen and curl up, you may trim the loose edges. Do not remove adhesive strips completely unless your health care provider tells you to do that.  Check your wound every day for signs of infection. Check for: ? More redness, swelling, or pain. ? More fluid or blood. ? Warmth. ? Pus or a bad smell. Medicines  Take or apply over-the-counter and prescription medicines only as told by your health care provider.  If you were prescribed an antibiotic, take or apply it as told by your health care provider. Do not stop using the antibiotic even if your condition improves. General instructions   Keep the injured   area raised (elevated) above the level of your heart while you are sitting or lying down, if this is possible.  If directed, put ice on the injured area. ? Put ice in a plastic bag. ? Place a towel between your skin and the bag. ? Leave the ice on for 20 minutes, 2-3 times per day.  Keep all follow-up visits as told by your health care provider. This is important. Contact a health care provider if:  You have more redness, swelling, or pain around your wound.  Your wound feels warm to the touch.  You have a fever or chills.  You have a  general feeling of sickness (malaise).  You feel nauseous or you vomit.  You have pain that does not get better. Get help right away if:  You have a red streak that leads away from your wound.  You have non-clear fluid or more blood coming from your wound.  There is pus or a bad smell coming from your wound.  You have trouble moving your injured area.  You have numbness or tingling that extends beyond the wound. Summary  Animal bites can range from mild to serious. An animal bite can cause a scratch on the skin, a deep open cut, a puncture of the skin, a crush injury, tearing away of the skin or a body part, or a bone injury.  Your health care provider will examine your wound and ask for details about the animal and how the bite happened.  You may also have tests such as a blood test, X-ray, or testing of a fluid sample from your wound (culture test).  Treatment may include wound care, antibiotic medicine, a tetanus shot, and rabies treatment if the animal could have rabies. This information is not intended to replace advice given to you by your health care provider. Make sure you discuss any questions you have with your health care provider. Document Released: 11/23/2010 Document Revised: 09/15/2016 Document Reviewed: 09/15/2016 Elsevier Interactive Patient Education  2019 [employer]  

## 2018-05-09 ENCOUNTER — Encounter: Payer: Self-pay | Admitting: Physician Assistant

## 2018-05-09 ENCOUNTER — Telehealth: Payer: Self-pay

## 2018-05-09 NOTE — Telephone Encounter (Signed)
Canceled patient for Friday due to the weather. Patient is going to try to send a picture to [place] but reports that her hand looks much better and the swelling has gone down.

## 2018-05-09 NOTE — Telephone Encounter (Signed)
Noted  

## 2018-05-10 ENCOUNTER — Encounter: Payer: Self-pay | Admitting: Physician Assistant

## 2018-05-10 DIAGNOSIS — J0100 Acute maxillary sinusitis, unspecified: Secondary | ICD-10-CM

## 2018-05-11 ENCOUNTER — Ambulatory Visit: Payer: Self-pay | Admitting: Physician Assistant

## 2018-05-11 MED ORDER — PREDNISONE 20 MG PO TABS: 20.0000 mg | ORAL_TABLET | Freq: Every day | ORAL | 0 refills | Status: DC

## 2018-05-11 NOTE — Addendum Note (Signed)
Addended by: Mar Daring on: 05/11/2018 04:11 PM   Modules accepted: Orders

## 2018-06-07 ENCOUNTER — Encounter: Payer: Self-pay | Admitting: Physician Assistant

## 2018-06-07 DIAGNOSIS — J0140 Acute pansinusitis, unspecified: Secondary | ICD-10-CM

## 2018-06-07 DIAGNOSIS — S61451A Open bite of right hand, initial encounter: Secondary | ICD-10-CM

## 2018-06-07 DIAGNOSIS — W5501XA Bitten by cat, initial encounter: Secondary | ICD-10-CM

## 2018-06-08 MED ORDER — AMOXICILLIN-POT CLAVULANATE 875-125 MG PO TABS: 1.0000 | ORAL_TABLET | Freq: Two times a day (BID) | ORAL | 0 refills | Status: DC

## 2018-07-03 ENCOUNTER — Other Ambulatory Visit: Payer: Self-pay | Admitting: Physician Assistant

## 2018-07-03 ENCOUNTER — Encounter: Payer: Self-pay | Admitting: Physician Assistant

## 2018-07-03 DIAGNOSIS — J0140 Acute pansinusitis, unspecified: Secondary | ICD-10-CM

## 2018-07-03 DIAGNOSIS — J3089 Other allergic rhinitis: Secondary | ICD-10-CM

## 2018-07-03 DIAGNOSIS — M2550 Pain in unspecified joint: Secondary | ICD-10-CM

## 2018-07-04 MED ORDER — IBUPROFEN 800 MG PO TABS: 800.0000 mg | ORAL_TABLET | Freq: Two times a day (BID) | ORAL | 2 refills | Status: DC

## 2018-07-06 ENCOUNTER — Encounter: Payer: Self-pay | Admitting: Physician Assistant

## 2018-07-06 DIAGNOSIS — J0140 Acute pansinusitis, unspecified: Secondary | ICD-10-CM

## 2018-07-06 MED ORDER — AMOXICILLIN-POT CLAVULANATE 875-125 MG PO TABS: 1.0000 | ORAL_TABLET | Freq: Two times a day (BID) | ORAL | 0 refills | Status: DC

## 2018-09-20 ENCOUNTER — Other Ambulatory Visit: Payer: Self-pay | Admitting: Physician Assistant

## 2018-09-20 DIAGNOSIS — J3089 Other allergic rhinitis: Secondary | ICD-10-CM

## 2018-11-14 ENCOUNTER — Encounter: Payer: Self-pay | Admitting: Physician Assistant

## 2018-11-14 DIAGNOSIS — J0140 Acute pansinusitis, unspecified: Secondary | ICD-10-CM

## 2018-11-14 MED ORDER — AMOXICILLIN-POT CLAVULANATE 875-125 MG PO TABS: 1.0000 | ORAL_TABLET | Freq: Two times a day (BID) | ORAL | 0 refills | Status: DC

## 2018-12-11 ENCOUNTER — Encounter: Payer: Self-pay | Admitting: Physician Assistant

## 2018-12-12 ENCOUNTER — Encounter: Payer: Self-pay | Admitting: Physician Assistant

## 2018-12-12 DIAGNOSIS — J0140 Acute pansinusitis, unspecified: Secondary | ICD-10-CM

## 2018-12-12 MED ORDER — AMOXICILLIN-POT CLAVULANATE 875-125 MG PO TABS: 1.0000 | ORAL_TABLET | Freq: Two times a day (BID) | ORAL | 0 refills | Status: DC

## 2018-12-12 NOTE — Telephone Encounter (Signed)
See other mychart message.

## 2019-01-02 ENCOUNTER — Other Ambulatory Visit: Payer: Self-pay | Admitting: Physician Assistant

## 2019-01-02 DIAGNOSIS — Z1231 Encounter for screening mammogram for malignant neoplasm of breast: Secondary | ICD-10-CM

## 2019-02-08 NOTE — Progress Notes (Signed)
I, Bretlyn Ward ,CMA am acting as a scribe for [employer] PA-C     Patient: Monica Nelson, Female    DOB: 1970-05-08, 48 y.o.   MRN: [id number] Visit Date: 02/11/2019  Today's Provider: Mar Daring, PA-C   Chief Complaint  Patient presents with  . Annual Exam   Subjective:     Annual physical exam DELANNA JOBST is a 48 y.o. female who presents today for health maintenance and complete physical. She feels well. She reports exercising occasionally walks. She reports she is sleeping well. ----------------------------------------------------------------- Pap:07/29/16-Normal with GYN Mammogram: Scheduled 02/11/2019  Review of Systems  Constitutional: Negative.   HENT: Negative.   Eyes: Negative.   Respiratory: Negative.   Cardiovascular: Negative.   Gastrointestinal: Negative.   Endocrine: Negative.   Genitourinary: Negative.   Musculoskeletal: Negative.   Skin: Negative.   Allergic/Immunologic: Negative.   Neurological: Negative.   Hematological: Negative.   Psychiatric/Behavioral: Negative.     Social History      She  reports that she has never smoked. She has never used smokeless tobacco. She reports current alcohol use. She reports that she does not use drugs.       Social History   Socioeconomic History  . Marital status: Single    Spouse name: Not on file  . Number of children: Not on file  . Years of education: Not on file  . Highest education level: Not on file  Occupational History  . Occupation: Development worker, international aid  Social Needs  . Financial resource strain: Not on file  . Food insecurity    Worry: Not on file    Inability: Not on file  . Transportation needs    Medical: Not on file    Non-medical: Not on file  Tobacco Use  . Smoking status: Never Smoker  . Smokeless tobacco: Never Used  Substance and Sexual Activity  . Alcohol use: Yes    Comment: occasional  . Drug use: No  . Sexual activity: Yes    Birth  control/protection: I.U.D.  Lifestyle  . Physical activity    Days per week: Not on file    Minutes per session: Not on file  . Stress: Not on file  Relationships  . Social Herbalist on phone: Not on file    Gets together: Not on file    Attends religious service: Not on file    Active member of club or organization: Not on file    Attends meetings of clubs or organizations: Not on file    Relationship status: Not on file  Other Topics Concern  . Not on file  Social History Narrative   Also works in Management consultant office    Past Medical History:  Diagnosis Date  . Allergic rhinitis   . Complex endometrial hyperplasia without atypia    currently treat with Mirena  . Hypertension      Patient Active Problem List   Diagnosis Date Noted  . Cardiac murmur 06/08/2015  . Allergic rhinitis 01/05/2015  . Blood pressure decreased 01/05/2015  . Bradycardia, drug induced 01/05/2015  . Chronic sinusitis 01/05/2015  . Dermatologic disease 09/07/2009  . Obesity 01/29/2009  . Blood pressure elevated without history of HTN 08/14/2008  . Excess, menstruation 07/31/2008  . Anemia, iron deficiency 05/31/2007    Past Surgical History:  Procedure Laterality Date  . BREAST BIOPSY Left 02/04/2014 x 2   negative  . ENDOMETRIAL BIOPSY     May  2010 complex hyperplasia, Nov 2011 prolif endometrium, 2014 prolif  . HYSTEROSCOPY W/D&C  2010    Family History        Family Status  Relation Name Status  . Mother  Alive  . Father  Deceased at age 56       bone cancer  . Neg Hx  (Not Specified)        Her family history includes Bone cancer in her father; Healthy in her mother; Thyroid disease in her mother. There is no history of Breast cancer.      No Known Allergies   Current Outpatient Medications:  .  Ascorbic Acid (VITAMIN C) 1000 MG tablet, Take 1,000 mg by mouth daily. , Disp: , Rfl:  .  cetirizine (ZYRTEC) 10 MG tablet, Take 10 mg by mouth daily., Disp: , Rfl:   .  fluticasone (FLONASE) 50 MCG/ACT nasal spray, SPRAY 2 SPRAYS INTO EACH NOSTRIL EVERY DAY, Disp: 48 mL, Rfl: 2 .  ibuprofen (ADVIL,MOTRIN) 800 MG tablet, Take 1 tablet (800 mg total) by mouth 2 (two) times daily., Disp: 60 tablet, Rfl: 2 .  levonorgestrel (MIRENA, 52 MG,) 20 MCG/24HR IUD, MIRENA, 20MCG/24HR (Intrauterine Intrauterine Device)  for 0 days  Quantity: 0.00;  Refills: 0   Ordered :19-Nov-2009  Ashley Royalty ;  Started 29-Jun-2009 Active Comments: DX: 626.2, Disp: , Rfl:  .  Misc Natural Products (OSTEO BI-FLEX ADV JOINT SHIELD) TABS, Take 2 tablets by mouth daily. , Disp: , Rfl:    Patient Care Team: Rubye Beach as PCP - General (Family Medicine)    Objective:    Vitals: BP 137/89   Pulse (!) 59   Temp (!) 97.1 F (36.2 C) (Temporal)   Resp 16   Ht 5\' 4"  (1.626 m)   Wt 197 lb (89.4 kg)   BMI 33.81 kg/m    Vitals:   02/11/19 0909  BP: 137/89  Pulse: (!) 59  Resp: 16  Temp: (!) 97.1 F (36.2 C)  TempSrc: Temporal  Weight: 197 lb (89.4 kg)  Height: 5\' 4"  (1.626 m)     Physical Exam Vitals signs reviewed.  Constitutional:      General: She is not in acute distress.    Appearance: Normal appearance. She is well-developed. She is obese. She is not ill-appearing or diaphoretic.  HENT:     Head: Normocephalic and atraumatic.     Right Ear: Tympanic membrane, ear canal and external ear normal.     Left Ear: Tympanic membrane, ear canal and external ear normal.     Nose: Nose normal.     Mouth/Throat:     Mouth: Mucous membranes are moist.     Pharynx: No oropharyngeal exudate.  Eyes:     General: No scleral icterus.       Right eye: No discharge.        Left eye: No discharge.     Extraocular Movements: Extraocular movements intact.     Conjunctiva/sclera: Conjunctivae normal.     Pupils: Pupils are equal, round, and reactive to light.  Neck:     Musculoskeletal: Normal range of motion and neck supple.     Thyroid: No thyromegaly.      Vascular: No carotid bruit or JVD.     Trachea: No tracheal deviation.  Cardiovascular:     Rate and Rhythm: Normal rate and regular rhythm.     Pulses: Normal pulses.     Heart sounds: Murmur (2/6) present. No friction rub. No gallop.  Pulmonary:     Effort: Pulmonary effort is normal. No respiratory distress.     Breath sounds: Normal breath sounds. No wheezing or rales.  Chest:     Chest wall: No tenderness.  Abdominal:     General: Abdomen is flat. Bowel sounds are normal. There is no distension.     Palpations: Abdomen is soft. There is no mass.     Tenderness: There is no abdominal tenderness. There is no guarding or rebound.  Musculoskeletal: Normal range of motion.        General: No tenderness.     Right lower leg: No edema.     Left lower leg: No edema.  Lymphadenopathy:     Cervical: No cervical adenopathy.  Skin:    General: Skin is warm and dry.     Capillary Refill: Capillary refill takes less than 2 seconds.     Findings: No rash.  Neurological:     General: No focal deficit present.     Mental Status: She is alert and oriented to person, place, and time. Mental status is at baseline.  Psychiatric:        Mood and Affect: Mood normal.        Behavior: Behavior normal.        Thought Content: Thought content normal.        Judgment: Judgment normal.      Depression Screen PHQ 2/9 Scores 02/11/2019 02/07/2018 01/09/2017 01/05/2016  PHQ - 2 Score 0 0 0 0  PHQ- 9 Score 0 - 0 -       Assessment & Plan:     Routine Health Maintenance and Physical Exam  Exercise Activities and Dietary recommendations Goals    . Exercise 150 minutes per week (moderate activity)       Immunization History  Administered Date(s) Administered  . Influenza,inj,Quad PF,6+ Mos 03/07/2015, 02/07/2018, 12/28/2018  . Td 05/07/2018  . Tdap 07/29/2005    Health Maintenance  Topic Date Due  . PAP SMEAR-Modifier  07/29/2021  . TETANUS/TDAP  05/07/2028  . INFLUENZA VACCINE   Completed  . HIV Screening  Completed     Discussed health benefits of physical activity, and encouraged her to engage in regular exercise appropriate for her age and condition.    1. Annual physical exam Normal physical exam today. Will check labs as below and f/u pending lab results. If labs are stable and WNL she will not need to have these rechecked for one year at her next annual physical exam. She is to call the office in the meantime if she has any acute issue, questions or concerns. - CBC w/Diff/Platelet - Comprehensive Metabolic Panel (CMET) - Lipid Profile - HgB A1c - TSH  2. Encounter for breast cancer screening using non-mammogram modality Breast exam today was normal. There is no family history of breast cancer. She does perform regular self breast exams. Mammogram was ordered as below. Information for [facility] was given to patient so she may schedule her mammogram at her convenience.  3. Other iron deficiency anemia H/O this due to menorrhagia. Will check labs as below and f/u pending results. - CBC w/Diff/Platelet - Comprehensive Metabolic Panel (CMET) - TSH  4. Class 1 obesity due to excess calories with serious comorbidity and body mass index (BMI) of 33.0 to 33.9 in adult Counseled patient on healthy lifestyle modifications including dieting and exercise.  - CBC w/Diff/Platelet - Comprehensive Metabolic Panel (CMET) - Lipid Profile - HgB A1c - TSH  5. Hypercholesterolemia Diet controlled. May start red yeast rice. Will check labs as below and f/u pending results. - Lipid Profile --------------------------------------------------------------------    Mar Daring, PA-C  [facility]

## 2019-02-11 ENCOUNTER — Ambulatory Visit
Admission: RE | Admit: 2019-02-11 | Discharge: 2019-02-11 | Disposition: A | Discharge status: Home / Self Care | Payer: BC Managed Care – PPO | Source: Ambulatory Visit | Attending: Physician Assistant | Admitting: Physician Assistant

## 2019-02-11 ENCOUNTER — Encounter: Payer: Self-pay | Admitting: Physician Assistant

## 2019-02-11 ENCOUNTER — Other Ambulatory Visit: Payer: Self-pay

## 2019-02-11 ENCOUNTER — Ambulatory Visit (INDEPENDENT_AMBULATORY_CARE_PROVIDER_SITE_OTHER): Payer: BC Managed Care – PPO | Admitting: Physician Assistant

## 2019-02-11 ENCOUNTER — Telehealth: Payer: Self-pay

## 2019-02-11 VITALS — BP 137/89 | HR 59 | Temp 97.1°F | Resp 16 | Ht 64.0 in | Wt 197.0 lb

## 2019-02-11 DIAGNOSIS — D508 Other iron deficiency anemias: Secondary | ICD-10-CM

## 2019-02-11 DIAGNOSIS — Z0000 Encounter for general adult medical examination without abnormal findings: Secondary | ICD-10-CM

## 2019-02-11 DIAGNOSIS — Z6833 Body mass index (BMI) 33.0-33.9, adult: Secondary | ICD-10-CM

## 2019-02-11 DIAGNOSIS — Z1239 Encounter for other screening for malignant neoplasm of breast: Secondary | ICD-10-CM

## 2019-02-11 DIAGNOSIS — E66811 Obesity, class 1: Secondary | ICD-10-CM

## 2019-02-11 DIAGNOSIS — E6609 Other obesity due to excess calories: Secondary | ICD-10-CM | POA: Diagnosis not present

## 2019-02-11 DIAGNOSIS — Z1231 Encounter for screening mammogram for malignant neoplasm of breast: Secondary | ICD-10-CM | POA: Diagnosis not present

## 2019-02-11 DIAGNOSIS — E7800 Pure hypercholesterolemia, unspecified: Secondary | ICD-10-CM

## 2019-02-11 NOTE — Telephone Encounter (Signed)
Left message advising pt.   Thanks,   -Manuel Dall  

## 2019-02-11 NOTE — Telephone Encounter (Signed)
-----   Message from Mar Daring, [place] sent at 02/11/2019  1:41 PM EST ----- Normal mammogram. Repeat screening in one year.

## 2019-02-11 NOTE — Patient Instructions (Signed)
Health Maintenance, Female Adopting a healthy lifestyle and getting preventive care are important in promoting health and wellness. Ask your health care provider about:  The right schedule for you to have regular tests and exams.  Things you can do on your own to prevent diseases and keep yourself healthy. What should I know about diet, weight, and exercise? Eat a healthy diet   Eat a diet that includes plenty of vegetables, fruits, low-fat dairy products, and lean protein.  Do not eat a lot of foods that are high in solid fats, added sugars, or sodium. Maintain a healthy weight Body mass index (BMI) is used to identify weight problems. It estimates body fat based on height and weight. Your health care provider can help determine your BMI and help you achieve or maintain a healthy weight. Get regular exercise Get regular exercise. This is one of the most important things you can do for your health. Most adults should:  Exercise for at least 150 minutes each week. The exercise should increase your heart rate and make you sweat (moderate-intensity exercise).  Do strengthening exercises at least twice a week. This is in addition to the moderate-intensity exercise.  Spend less time sitting. Even light physical activity can be beneficial. Watch cholesterol and blood lipids Have your blood tested for lipids and cholesterol at 48 years of age, then have this test every 5 years. Have your cholesterol levels checked more often if:  Your lipid or cholesterol levels are high.  You are older than 48 years of age.  You are at high risk for heart disease. What should I know about cancer screening? Depending on your health history and family history, you may need to have cancer screening at various ages. This may include screening for:  Breast cancer.  Cervical cancer.  Colorectal cancer.  Skin cancer.  Lung cancer. What should I know about heart disease, diabetes, and high blood  pressure? Blood pressure and heart disease  High blood pressure causes heart disease and increases the risk of stroke. This is more likely to develop in people who have high blood pressure readings, are of African descent, or are overweight.  Have your blood pressure checked: ? Every 3-5 years if you are 18-39 years of age. ? Every year if you are 40 years old or older. Diabetes Have regular diabetes screenings. This checks your fasting blood sugar level. Have the screening done:  Once every three years after age 40 if you are at a normal weight and have a low risk for diabetes.  More often and at a younger age if you are overweight or have a high risk for diabetes. What should I know about preventing infection? Hepatitis B If you have a higher risk for hepatitis B, you should be screened for this virus. Talk with your health care provider to find out if you are at risk for hepatitis B infection. Hepatitis C Testing is recommended for:  Everyone born from 1945 through 1965.  Anyone with known risk factors for hepatitis C. Sexually transmitted infections (STIs)  Get screened for STIs, including gonorrhea and chlamydia, if: ? You are sexually active and are younger than 48 years of age. ? You are older than 48 years of age and your health care provider tells you that you are at risk for this type of infection. ? Your sexual activity has changed since you were last screened, and you are at increased risk for chlamydia or gonorrhea. Ask your health care provider if   you are at risk.  Ask your health care provider about whether you are at high risk for HIV. Your health care provider may recommend a prescription medicine to help prevent HIV infection. If you choose to take medicine to prevent HIV, you should first get tested for HIV. You should then be tested every 3 months for as long as you are taking the medicine. Pregnancy  If you are about to stop having your period (premenopausal) and  you may become pregnant, seek counseling before you get pregnant.  Take 400 to 800 micrograms (mcg) of folic acid every day if you become pregnant.  Ask for birth control (contraception) if you want to prevent pregnancy. Osteoporosis and menopause Osteoporosis is a disease in which the bones lose minerals and strength with aging. This can result in bone fractures. If you are 65 years old or older, or if you are at risk for osteoporosis and fractures, ask your health care provider if you should:  Be screened for bone loss.  Take a calcium or vitamin D supplement to lower your risk of fractures.  Be given hormone replacement therapy (HRT) to treat symptoms of menopause. Follow these instructions at home: Lifestyle  Do not use any products that contain nicotine or tobacco, such as cigarettes, e-cigarettes, and chewing tobacco. If you need help quitting, ask your health care provider.  Do not use street drugs.  Do not share needles.  Ask your health care provider for help if you need support or information about quitting drugs. Alcohol use  Do not drink alcohol if: ? Your health care provider tells you not to drink. ? You are pregnant, may be pregnant, or are planning to become pregnant.  If you drink alcohol: ? Limit how much you use to 0-1 drink a day. ? Limit intake if you are breastfeeding.  Be aware of how much alcohol is in your drink. In the [place] one drink equals one 12 oz bottle of beer (355 mL), one 5 oz glass of wine (148 mL), or one 1 oz glass of hard liquor (44 mL). General instructions  Schedule regular health, dental, and eye exams.  Stay current with your vaccines.  Tell your health care provider if: ? You often feel depressed. ? You have ever been abused or do not feel safe at home. Summary  Adopting a healthy lifestyle and getting preventive care are important in promoting health and wellness.  Follow your health care provider's instructions about healthy  diet, exercising, and getting tested or screened for diseases.  Follow your health care provider's instructions on monitoring your cholesterol and blood pressure. This information is not intended to replace advice given to you by your health care provider. Make sure you discuss any questions you have with your health care provider. Document Released: 09/20/2010 Document Revised: 02/28/2018 Document Reviewed: 02/28/2018 Elsevier Patient Education  2020 [employer]  

## 2019-02-12 ENCOUNTER — Telehealth: Payer: Self-pay | Admitting: *Deleted

## 2019-02-12 ENCOUNTER — Encounter: Payer: Self-pay | Admitting: Physician Assistant

## 2019-02-12 ENCOUNTER — Telehealth: Payer: Self-pay

## 2019-02-12 ENCOUNTER — Ambulatory Visit: Payer: Self-pay | Admitting: *Deleted

## 2019-02-12 LAB — COMPREHENSIVE METABOLIC PANEL
ALT: 9 IU/L (ref 0–32)
AST: 12 IU/L (ref 0–40)
Albumin/Globulin Ratio: 1.7 (ref 1.2–2.2)
Albumin: 4.2 g/dL (ref 3.8–4.8)
Alkaline Phosphatase: 68 IU/L (ref 39–117)
BUN/Creatinine Ratio: 26 — ABNORMAL HIGH (ref 9–23)
BUN: 16 mg/dL (ref 6–24)
Bilirubin Total: 0.7 mg/dL (ref 0.0–1.2)
CO2: 22 mmol/L (ref 20–29)
Calcium: 8.8 mg/dL (ref 8.7–10.2)
Chloride: 102 mmol/L (ref 96–106)
Creatinine, Ser: 0.61 mg/dL (ref 0.57–1.00)
GFR calc Af Amer: 124 mL/min/{1.73_m2} (ref >59)
GFR calc non Af Amer: 108 mL/min/{1.73_m2} (ref >59)
Globulin, Total: 2.5 g/dL (ref 1.5–4.5)
Glucose: 85 mg/dL (ref 65–99)
Potassium: 4.4 mmol/L (ref 3.5–5.2)
Sodium: 138 mmol/L (ref 134–144)
Total Protein: 6.7 g/dL (ref 6.0–8.5)

## 2019-02-12 LAB — HEMOGLOBIN A1C
Est. average glucose Bld gHb Est-mCnc: 108 mg/dL
Hgb A1c MFr Bld: 5.4 % (ref 4.8–5.6)

## 2019-02-12 LAB — CBC WITH DIFFERENTIAL/PLATELET
Basophils Absolute: 0.1 10*3/uL (ref 0.0–0.2)
Basos: 1 %
EOS (ABSOLUTE): 0.5 10*3/uL — ABNORMAL HIGH (ref 0.0–0.4)
Eos: 5 %
Hematocrit: 41.4 % (ref 34.0–46.6)
Hemoglobin: 13.8 g/dL (ref 11.1–15.9)
Immature Grans (Abs): 0 10*3/uL (ref 0.0–0.1)
Immature Granulocytes: 1 %
Lymphocytes Absolute: 1.9 10*3/uL (ref 0.7–3.1)
Lymphs: 22 %
MCH: 29.6 pg (ref 26.6–33.0)
MCHC: 33.3 g/dL (ref 31.5–35.7)
MCV: 89 fL (ref 79–97)
Monocytes Absolute: 0.6 10*3/uL (ref 0.1–0.9)
Monocytes: 7 %
Neutrophils Absolute: 5.5 10*3/uL (ref 1.4–7.0)
Neutrophils: 64 %
Platelets: 254 10*3/uL (ref 150–450)
RBC: 4.67 x10E6/uL (ref 3.77–5.28)
RDW: 12.7 % (ref 11.7–15.4)
WBC: 8.6 10*3/uL (ref 3.4–10.8)

## 2019-02-12 LAB — LIPID PANEL
Chol/HDL Ratio: 3.3 ratio (ref 0.0–4.4)
Cholesterol, Total: 171 mg/dL (ref 100–199)
HDL: 52 mg/dL (ref >39)
LDL Chol Calc (NIH): 103 mg/dL — ABNORMAL HIGH (ref 0–99)
Triglycerides: 86 mg/dL (ref 0–149)
VLDL Cholesterol Cal: 16 mg/dL (ref 5–40)

## 2019-02-12 LAB — TSH: TSH: 3.06 u[IU]/mL (ref 0.450–4.500)

## 2019-02-12 NOTE — Telephone Encounter (Signed)
LMTCB 02/12/2019  Thanks,   -Mickel Baas

## 2019-02-12 NOTE — Telephone Encounter (Signed)
Opened by [employer]

## 2019-02-12 NOTE — Telephone Encounter (Signed)
Marijo Conception, RN  [facility] 11 minutes ago (2:20 PM)   This pt called in and was given her lab results.   Routing comment     Marijo Conception, RN 11 minutes ago (2:20 PM)     Pt called in and was given lab result message from Fenton Malling, PA-C dated 02/12/2019 12:41 PM.

## 2019-02-12 NOTE — Telephone Encounter (Signed)
Pt called in and was given lab result message from Fenton Malling, PA-C dated 02/12/2019 12:41 PM.

## 2019-02-12 NOTE — Telephone Encounter (Signed)
-----   Message from Mar Daring, [place] sent at 02/12/2019 12:41 PM EST ----- Blood count is normal. Kidney and liver function are normal. Sodium, potassium and calcium are normal. Cholesterol normal. A1c/sugar is normal. Thyroid is normal.

## 2019-03-26 ENCOUNTER — Telehealth: Payer: Self-pay | Admitting: Certified Nurse Midwife

## 2019-03-26 NOTE — Telephone Encounter (Signed)
Patient is schedule for 05/01/19 at 2 pm with [facility] for annual and mirena replacement

## 2019-03-27 NOTE — Telephone Encounter (Signed)
Noted. Will order to arrive by apt date/time. 

## 2019-04-08 ENCOUNTER — Encounter: Payer: Self-pay | Admitting: Physician Assistant

## 2019-04-08 DIAGNOSIS — H66003 Acute suppurative otitis media without spontaneous rupture of ear drum, bilateral: Secondary | ICD-10-CM

## 2019-04-08 MED ORDER — AMOXICILLIN 875 MG PO TABS: 875.0000 mg | ORAL_TABLET | Freq: Two times a day (BID) | ORAL | 0 refills | Status: DC

## 2019-04-11 ENCOUNTER — Encounter: Payer: Self-pay | Admitting: Physician Assistant

## 2019-04-11 DIAGNOSIS — J0140 Acute pansinusitis, unspecified: Secondary | ICD-10-CM

## 2019-04-11 MED ORDER — AMOXICILLIN-POT CLAVULANATE 875-125 MG PO TABS: 1.0000 | ORAL_TABLET | Freq: Two times a day (BID) | ORAL | 0 refills | Status: DC

## 2019-04-25 NOTE — Telephone Encounter (Signed)
Mirena reserved for this patient.

## 2019-05-02 ENCOUNTER — Encounter: Payer: Self-pay | Admitting: Certified Nurse Midwife

## 2019-05-02 ENCOUNTER — Ambulatory Visit (INDEPENDENT_AMBULATORY_CARE_PROVIDER_SITE_OTHER): Payer: BC Managed Care – PPO | Admitting: Certified Nurse Midwife

## 2019-05-02 ENCOUNTER — Other Ambulatory Visit: Payer: Self-pay

## 2019-05-02 VITALS — BP 132/86 | HR 72 | Temp 96.3°F | Ht 64.0 in | Wt 199.0 lb

## 2019-05-02 DIAGNOSIS — T8332XA Displacement of intrauterine contraceptive device, initial encounter: Secondary | ICD-10-CM | POA: Diagnosis not present

## 2019-05-02 DIAGNOSIS — Z01419 Encounter for gynecological examination (general) (routine) without abnormal findings: Secondary | ICD-10-CM | POA: Diagnosis not present

## 2019-05-02 DIAGNOSIS — N912 Amenorrhea, unspecified: Secondary | ICD-10-CM

## 2019-05-02 DIAGNOSIS — Z30431 Encounter for routine checking of intrauterine contraceptive device: Secondary | ICD-10-CM

## 2019-05-02 NOTE — Progress Notes (Signed)
Gynecology Annual Exam  PCP: Mar Daring, PA-C  Chief Complaint:  Chief Complaint  Patient presents with  . Gynecologic Exam    History of Present Illness: Monica Nelson is a 49 year old Caucasian/White female, G0 P0000, who presents for her annual exam. She is having no significant GYN problems.  Her menses are absent due to her Mirena IUD.  She has intermittent spotting and she has had no hot flashes..   The patient's past medical history is notable for a history of a history of complex endometrial hyperplasia without atypia, currently being treated with Mirena IUD which was replaced 03/28/2014. Had two endometrial biopsies after her D&C in 2010 which showed proliferative endometrium.  Since her last annual GYN exam dated 08/04/2016,  she has had no significant changes in her health history. Had a physical exam including a breast exam by her PCP 02/11/2019  Her most recent pap smear was obtained 08/04/2016 and was NIL Her most recent mammogram obtained on 02/11/2019 was negative.  There is no family history of breast cancer.  There is no family history of ovarian cancer.  The patient does do occ monthly self breast exams.  The patient does not smoke.  The patient does drink alcohol occasionally The patient does not use illegal drugs.  The patient does not exercise, but she is very active at her jobs.  The patient does get adequate calcium in her diet and with her supplement. She had a recent cholesterol screen in 2020 by PCP Nech[facility] Guard ([facility]) that was normal.     Review of Systems: Review of Systems  Constitutional: Negative for chills, fever and weight loss.  HENT: Negative for congestion, sinus pain and sore throat.   Eyes: Negative for blurred vision and pain.  Respiratory: Negative for hemoptysis, shortness of breath and wheezing.   Cardiovascular: Negative for chest pain, palpitations and leg swelling.  Gastrointestinal: Negative for abdominal pain,  blood in stool, diarrhea, heartburn, nausea and vomiting.  Genitourinary: Negative for dysuria, frequency, hematuria and urgency.  Musculoskeletal: Negative for back pain, joint pain and myalgias.  Skin: Negative for itching and rash.  Neurological: Negative for dizziness, tingling and headaches.  Endo/Heme/Allergies: Positive for environmental allergies. Negative for polydipsia. Does not bruise/bleed easily.       Negative for hirsutism   Psychiatric/Behavioral: Negative for depression. The patient is not nervous/anxious and does not have insomnia.     Past Medical History:  Past Medical History:  Diagnosis Date  . Allergic rhinitis   . Complex endometrial hyperplasia without atypia    currently treat with Mirena  . Hypertension     Past Surgical History:  Past Surgical History:  Procedure Laterality Date  . BREAST BIOPSY Left 02/04/2014 x 2   negative  . ENDOMETRIAL BIOPSY     May 2010 complex hyperplasia, Nov 2011 prolif endometrium, 2014 prolif  . HYSTEROSCOPY WITH D & C  2010       Family History:  Family History  Problem Relation Age of Onset  . Healthy Mother   . Thyroid disease Mother   . Bone cancer Father   . Breast cancer Neg Hx     Social History:  Social History   Socioeconomic History  . Marital status: Single    Spouse name: Not on file  . Number of children: Not on file  . Years of education: Not on file  . Highest education level: Not on file  Occupational History  .  Occupation: Development worker, international aid  Tobacco Use  . Smoking status: Never Smoker  . Smokeless tobacco: Never Used  Substance and Sexual Activity  . Alcohol use: Yes    Comment: occasional  . Drug use: No  . Sexual activity: Yes    Birth control/protection: I.U.D.  Other Topics Concern  . Not on file  Social History Narrative   Also works in Management consultant office   Social Determinants of Health   Financial Resource Strain:   . Difficulty of Paying Living Expenses:  Not on file  Food Insecurity:   . Worried About Charity fundraiser in the Last Year: Not on file  . Ran Out of Food in the Last Year: Not on file  Transportation Needs:   . Lack of Transportation (Medical): Not on file  . Lack of Transportation (Non-Medical): Not on file  Physical Activity:   . Days of Exercise per Week: Not on file  . Minutes of Exercise per Session: Not on file  Stress:   . Feeling of Stress : Not on file  Social Connections:   . Frequency of Communication with Friends and Family: Not on file  . Frequency of Social Gatherings with Friends and Family: Not on file  . Attends Religious Services: Not on file  . Active Member of Clubs or Organizations: Not on file  . Attends Archivist Meetings: Not on file  . Marital Status: Not on file  Intimate Partner Violence:   . Fear of Current or Ex-Partner: Not on file  . Emotionally Abused: Not on file  . Physically Abused: Not on file  . Sexually Abused: Not on file    Allergies:  No Known Allergies  Medications: Current Outpatient Medications:  .  Ascorbic Acid (VITAMIN C) 1000 MG tablet, Take 1,000 mg by mouth daily. , Disp: , Rfl:  .  Calcium Carbonate-Vitamin D 600-200 MG-UNIT TABS, Take 1 tablet by mouth daily., Disp: , Rfl:  .  cetirizine (ZYRTEC) 10 MG tablet, Take 10 mg by mouth daily., Disp: , Rfl:  .  fluticasone (FLONASE) 50 MCG/ACT nasal spray, SPRAY 2 SPRAYS INTO EACH NOSTRIL EVERY DAY, Disp: 48 mL, Rfl: 2 .  ibuprofen (ADVIL,MOTRIN) 800 MG tablet, Take 1 tablet (800 mg total) by mouth 2 (two) times daily., Disp: 60 tablet, Rfl: 2 .  levonorgestrel (MIRENA, 52 MG,) 20 MCG/24HR IUD, MIRENA, 20MCG/24HR (Intrauterine Intrauterine Device)  for 0 days  Quantity: 0.00;  Refills: 0   Ordered :19-Nov-2009  Ashley Royalty ;  Started 29-Jun-2009 Active Comments: DX: 626.2, Disp: , Rfl:  .  Misc Natural Products (OSTEO BI-FLEX ADV JOINT SHIELD) TABS, Take 2 tablets by mouth daily. , Disp: , Rfl:  .  Red Yeast  Rice 600 MG CAPS, Take 1-2 capsules by mouth daily., Disp: , Rfl:     Physical Exam Vitals: BP 132/86   Pulse 72   Temp (!) 96.3 F (35.7 C)   Ht 5\' 4"  (1.626 m)   Wt 199 lb (90.3 kg)   LMP  (LMP Unknown)   BMI 34.16 kg/m   General: WF in NAD HEENT: normocephalic, anicteric Thyroid: no enlargement, no palpable nodules Abdomen: soft, non-tender, non-distended.  Umbilicus without lesions.  No hepatomegaly or masses palpable. No evidence of hernia  Genitourinary:  External: Normal external female genitalia.  Normal urethral meatus, normal Bartholin's and Skene's glands.    Vagina: Normal vaginal mucosa, no evidence of prolapse.    Cervix: Grossly normal in appearance, posterior,  no  bleeding, IUD strings not present at os  Uterus: AV, mobile, NSSC, NT  Adnexa: no adnexal masses, NT  Rectal: deferred  Lymphatic: no evidence of inguinal lymphadenopathy Extremities: no edema, erythema, or tenderness Neurologic: Grossly intact Psychiatric: mood appropriate, affect full     Assessment: 49 y.o. G0P0000 well woman exam Missing IUD strings  Plan:  1) Mammogram - recommend yearly screening mammogram.  Mammogram is UTD  2) Pap smear not done-opts for Pap smear every 3 years. Next one due next year  3) Contraception: Will get pelvic ultrasound to check IUD location. If in correct place can leave in for another year. Will get [place] and LH to check menopause status. If [place] and LH elevated, consider removing IUD next year. If [place] and LH normal, may need replacement next year.  4) Routine maintenance including cholesterol, diabetes screening  managed by PCP   5) Discussed calcium and vitamin D3 requirements\  6) Follow up after pelvic ultrasound  Dalia Heading, CNM

## 2019-05-03 LAB — FSH/LH
FSH: 7.2 m[IU]/mL
LH: 2.3 m[IU]/mL

## 2019-05-03 NOTE — Telephone Encounter (Signed)
R/S to 05/17/19 w/u/s for current IUD location. Mirena remains reserved.

## 2019-05-08 ENCOUNTER — Other Ambulatory Visit: Payer: Self-pay | Admitting: Physician Assistant

## 2019-05-08 DIAGNOSIS — M2550 Pain in unspecified joint: Secondary | ICD-10-CM

## 2019-05-17 ENCOUNTER — Encounter: Payer: Self-pay | Admitting: Certified Nurse Midwife

## 2019-05-17 ENCOUNTER — Ambulatory Visit (INDEPENDENT_AMBULATORY_CARE_PROVIDER_SITE_OTHER): Payer: BC Managed Care – PPO

## 2019-05-17 ENCOUNTER — Ambulatory Visit (INDEPENDENT_AMBULATORY_CARE_PROVIDER_SITE_OTHER): Payer: BC Managed Care – PPO | Admitting: Certified Nurse Midwife

## 2019-05-17 ENCOUNTER — Other Ambulatory Visit: Payer: Self-pay

## 2019-05-17 VITALS — BP 120/98 | HR 66 | Ht 64.0 in | Wt 200.0 lb

## 2019-05-17 DIAGNOSIS — N912 Amenorrhea, unspecified: Secondary | ICD-10-CM

## 2019-05-17 DIAGNOSIS — T8332XD Displacement of intrauterine contraceptive device, subsequent encounter: Secondary | ICD-10-CM

## 2019-05-17 DIAGNOSIS — D259 Leiomyoma of uterus, unspecified: Secondary | ICD-10-CM | POA: Diagnosis not present

## 2019-05-17 DIAGNOSIS — T8332XA Displacement of intrauterine contraceptive device, initial encounter: Secondary | ICD-10-CM

## 2019-05-17 DIAGNOSIS — Z975 Presence of (intrauterine) contraceptive device: Secondary | ICD-10-CM

## 2019-05-17 NOTE — Progress Notes (Addendum)
  [id number] Monica Nelson is a 49 year old Caucasian/White female, G0 P0000, who presents for a follow up after an ultrasound for location of IUD. At her recent annual exam, her IUD strings were not visualized. The patient's past medical history is notable for a history of a history of complex endometrial hyperplasia without atypia, currently being treated with Mirena IUD which was replaced 03/28/2014. Had two endometrial biopsies after her D&C in 2010 which showed proliferative endometrium.    Ultrasound demonstrates that the IUD is in a proper location.  The EM stripe is 3 mm. There is one small fibroid seen.  PMHx: She  has a past medical history of Allergic rhinitis, Complex endometrial hyperplasia without atypia, and Hypertension. Also,  has a past surgical history that includes Hysteroscopy with D & C (2010); Endometrial biopsy; and Breast biopsy (Left, 02/04/2014 x 2)., family history includes Bone cancer in her father; Healthy in her mother; Thyroid disease in her mother.,  reports that she has never smoked. She has never used smokeless tobacco. She reports current alcohol use. She reports that she does not use drugs.  She has a current medication list which includes the following prescription(s): vitamin c, calcium carbonate-vitamin d, cetirizine, fluticasone, ibuprofen, levonorgestrel, osteo bi-flex adv joint shield, and red yeast rice. Also, has No Known Allergies.  ROS  Objective: BP (!) 120/98   Pulse 66   Ht 5\' 4"  (1.626 m)   Wt 200 lb (90.7 kg)   LMP  (LMP Unknown)   BMI 34.33 kg/m   Physical examination Constitutional NAD, Conversant  Skin No rashes, lesions or ulceration.   Extremities: Moves all appropriately.  Normal ROM for age. No lymphadenopathy.  Neuro: Grossly intact  Psych: Oriented to PPT.  Normal mood. Normal affect.   An [place] and LH this month was normal: 7.2 and 2.3  Assessment: IUD in correct position Recommend leaving the IUD in for a 6th year and repeating  an [facility] and LH next year  If the [facility] and LH is normal, would recommend replacing her IUD. If her [place] and LH is elevated, would leave the IUD in for another year. PAtient is amenable to this plan. Also discussed process to remove IUD when strings are not visible. Dalia Heading, CNM  Spent 15 minutes in face to face conference to discuss ultrasound findings and plan of management  Dalia Heading, CNM

## 2019-06-04 ENCOUNTER — Encounter: Payer: Self-pay | Admitting: Physician Assistant

## 2019-06-05 NOTE — Telephone Encounter (Signed)
Yes can we schedule her a virtual appt please

## 2019-06-06 ENCOUNTER — Encounter: Payer: Self-pay | Admitting: Physician Assistant

## 2019-06-06 ENCOUNTER — Ambulatory Visit (INDEPENDENT_AMBULATORY_CARE_PROVIDER_SITE_OTHER): Payer: BC Managed Care – PPO | Admitting: Physician Assistant

## 2019-06-06 VITALS — Temp 98.2°F

## 2019-06-06 DIAGNOSIS — J0140 Acute pansinusitis, unspecified: Secondary | ICD-10-CM

## 2019-06-06 MED ORDER — AMOXICILLIN-POT CLAVULANATE 875-125 MG PO TABS: 1.0000 | ORAL_TABLET | Freq: Two times a day (BID) | ORAL | 0 refills | Status: DC

## 2019-06-06 NOTE — Progress Notes (Signed)
Patient: Monica Nelson Female    DOB: Jul 07, 1970   49 y.o.   MRN: [id number] Visit Date: 06/06/2019  Today's Provider: Mar Daring, PA-C   Chief Complaint  Patient presents with  . Sinus Problem   Subjective:     Virtual Visit via Telephone Note  I connected with Monica Nelson on 06/06/19 at  6:00 PM EDT by telephone and verified that I am speaking with the correct person using two identifiers.  Location: Patient: Home Provider: [facility]   I discussed the limitations, risks, security and privacy concerns of performing an evaluation and management service by telephone and the availability of in person appointments. I also discussed with the patient that there may be a patient responsible charge related to this service. The patient expressed understanding and agreed to proceed.  HPI  Patient with c/o sinus problem. Symptoms present for a few days. Reports that when she blows her nose her ears pop. She is having sinus pain, sinus pressure. Reports no Fever, no sore throat, or headache. She is having yellow thick mucus when she blows her nose.  No Known Allergies   Current Outpatient Medications:  .  Ascorbic Acid (VITAMIN C) 1000 MG tablet, Take 1,000 mg by mouth daily. , Disp: , Rfl:  .  Calcium Carbonate-Vitamin D 600-200 MG-UNIT TABS, Take 1 tablet by mouth daily., Disp: , Rfl:  .  cetirizine (ZYRTEC) 10 MG tablet, Take 10 mg by mouth daily., Disp: , Rfl:  .  fluticasone (FLONASE) 50 MCG/ACT nasal spray, SPRAY 2 SPRAYS INTO EACH NOSTRIL EVERY DAY, Disp: 48 mL, Rfl: 2 .  ibuprofen (ADVIL) 800 MG tablet, TAKE 1 TABLET (800 MG TOTAL) BY MOUTH 2 (TWO) TIMES DAILY., Disp: 60 tablet, Rfl: 2 .  levonorgestrel (MIRENA, 52 MG,) 20 MCG/24HR IUD, MIRENA, 20MCG/24HR (Intrauterine Intrauterine Device)  for 0 days  Quantity: 0.00;  Refills: 0   Ordered :19-Nov-2009  Monica Nelson ;  Started 29-Jun-2009 Active Comments: DX: 626.2, Disp: , Rfl:  .  Misc Natural Products (OSTEO  BI-FLEX ADV JOINT SHIELD) TABS, Take 2 tablets by mouth daily. , Disp: , Rfl:  .  Red Yeast Rice 600 MG CAPS, Take 1-2 capsules by mouth daily., Disp: , Rfl:   Review of Systems  Constitutional: Negative.   Respiratory: Negative.   Cardiovascular: Negative.   Neurological: Negative.     Social History   Tobacco Use  . Smoking status: Never Smoker  . Smokeless tobacco: Never Used  Substance Use Topics  . Alcohol use: Yes    Comment: occasional      Objective:   Temp 98.2 F (36.8 C) (Oral)  Vitals:   06/06/19 1808  Temp: 98.2 F (36.8 C)  TempSrc: Oral  There is no height or weight on file to calculate BMI.   Physical Exam Vitals reviewed.  Constitutional:      General: She is not in acute distress. Pulmonary:     Effort: No respiratory distress.  Neurological:     Mental Status: She is alert.      No results found for any visits on 06/06/19.     Assessment & Plan     1. Acute non-recurrent pansinusitis Worsening symptoms that have not responded to OTC medications. Will give augmentin as below. Continue allergy medications. Stay well hydrated and get plenty of rest. Call if no symptom improvement or if symptoms worsen. - amoxicillin-clavulanate (AUGMENTIN) 875-125 MG tablet; Take 1 tablet by mouth 2 (two)  times daily.  Dispense: 20 tablet; Refill: 0   I discussed the assessment and treatment plan with the patient. The patient was provided an opportunity to ask questions and all were answered. The patient agreed with the plan and demonstrated an understanding of the instructions.   The patient was advised to call back or seek an in-person evaluation if the symptoms worsen or if the condition fails to improve as anticipated.  I provided 9 minutes of non-face-to-face time during this encounter.    Mar Daring, PA-C  [facility]

## 2019-06-06 NOTE — Patient Instructions (Signed)
Sinusitis, Adult Sinusitis is inflammation of your sinuses. Sinuses are hollow spaces in the bones around your face. Your sinuses are located:  Around your eyes.  In the middle of your forehead.  Behind your nose.  In your cheekbones. Mucus normally drains out of your sinuses. When your nasal tissues become inflamed or swollen, mucus can become trapped or blocked. This allows bacteria, viruses, and fungi to grow, which leads to infection. Most infections of the sinuses are caused by a virus. Sinusitis can develop quickly. It can last for up to 4 weeks (acute) or for more than 12 weeks (chronic). Sinusitis often develops after a cold. What are the causes? This condition is caused by anything that creates swelling in the sinuses or stops mucus from draining. This includes:  Allergies.  Asthma.  Infection from bacteria or viruses.  Deformities or blockages in your nose or sinuses.  Abnormal growths in the nose (nasal polyps).  Pollutants, such as chemicals or irritants in the air.  Infection from fungi (rare). What increases the risk? You are more likely to develop this condition if you:  Have a weak body defense system (immune system).  Do a lot of swimming or diving.  Overuse nasal sprays.  Smoke. What are the signs or symptoms? The main symptoms of this condition are pain and a feeling of pressure around the affected sinuses. Other symptoms include:  Stuffy nose or congestion.  Thick drainage from your nose.  Swelling and warmth over the affected sinuses.  Headache.  Upper toothache.  A cough that may get worse at night.  Extra mucus that collects in the throat or the back of the nose (postnasal drip).  Decreased sense of smell and taste.  Fatigue.  A fever.  Sore throat.  Bad breath. How is this diagnosed? This condition is diagnosed based on:  Your symptoms.  Your medical history.  A physical exam.  Tests to find out if your condition is  acute or chronic. This may include: ? Checking your nose for nasal polyps. ? Viewing your sinuses using a device that has a light (endoscope). ? Testing for allergies or bacteria. ? Imaging tests, such as an MRI or CT scan. In rare cases, a bone biopsy may be done to rule out more serious types of fungal sinus disease. How is this treated? Treatment for sinusitis depends on the cause and whether your condition is chronic or acute.  If caused by a virus, your symptoms should go away on their own within 10 days. You may be given medicines to relieve symptoms. They include: ? Medicines that shrink swollen nasal passages (topical intranasal decongestants). ? Medicines that treat allergies (antihistamines). ? A spray that eases inflammation of the nostrils (topical intranasal corticosteroids). ? Rinses that help get rid of thick mucus in your nose (nasal saline washes).  If caused by bacteria, your health care provider may recommend waiting to see if your symptoms improve. Most bacterial infections will get better without antibiotic medicine. You may be given antibiotics if you have: ? A severe infection. ? A weak immune system.  If caused by narrow nasal passages or nasal polyps, you may need to have surgery. Follow these instructions at home: Medicines  Take, use, or apply over-the-counter and prescription medicines only as told by your health care provider. These may include nasal sprays.  If you were prescribed an antibiotic medicine, take it as told by your health care provider. Do not stop taking the antibiotic even if you start   to feel better. Hydrate and humidify   Drink enough fluid to keep your urine pale yellow. Staying hydrated will help to thin your mucus.  Use a cool mist humidifier to keep the humidity level in your home above 50%.  Inhale steam for 10-15 minutes, 3-4 times a day, or as told by your health care provider. You can do this in the bathroom while a hot shower is  running.  Limit your exposure to cool or dry air. Rest  Rest as much as possible.  Sleep with your head raised (elevated).  Make sure you get enough sleep each night. General instructions   Apply a warm, moist washcloth to your face 3-4 times a day or as told by your health care provider. This will help with discomfort.  Wash your hands often with soap and water to reduce your exposure to germs. If soap and water are not available, use hand sanitizer.  Do not smoke. Avoid being around people who are smoking (secondhand smoke).  Keep all follow-up visits as told by your health care provider. This is important. Contact a health care provider if:  You have a fever.  Your symptoms get worse.  Your symptoms do not improve within 10 days. Get help right away if:  You have a severe headache.  You have persistent vomiting.  You have severe pain or swelling around your face or eyes.  You have vision problems.  You develop confusion.  Your neck is stiff.  You have trouble breathing. Summary  Sinusitis is soreness and inflammation of your sinuses. Sinuses are hollow spaces in the bones around your face.  This condition is caused by nasal tissues that become inflamed or swollen. The swelling traps or blocks the flow of mucus. This allows bacteria, viruses, and fungi to grow, which leads to infection.  If you were prescribed an antibiotic medicine, take it as told by your health care provider. Do not stop taking the antibiotic even if you start to feel better.  Keep all follow-up visits as told by your health care provider. This is important. This information is not intended to replace advice given to you by your health care provider. Make sure you discuss any questions you have with your health care provider. Document Revised: 08/07/2017 Document Reviewed: 08/07/2017 Elsevier Patient Education  2020 [employer]  

## 2019-06-27 ENCOUNTER — Encounter: Payer: Self-pay | Admitting: Physician Assistant

## 2019-06-27 DIAGNOSIS — J0140 Acute pansinusitis, unspecified: Secondary | ICD-10-CM

## 2019-06-27 MED ORDER — AMOXICILLIN-POT CLAVULANATE 875-125 MG PO TABS: 1.0000 | ORAL_TABLET | Freq: Two times a day (BID) | ORAL | 0 refills | Status: DC

## 2020-02-05 ENCOUNTER — Other Ambulatory Visit: Payer: Self-pay | Admitting: Physician Assistant

## 2020-02-05 DIAGNOSIS — Z1231 Encounter for screening mammogram for malignant neoplasm of breast: Secondary | ICD-10-CM

## 2020-02-10 ENCOUNTER — Encounter: Payer: Self-pay | Admitting: Physician Assistant

## 2020-02-10 DIAGNOSIS — J0140 Acute pansinusitis, unspecified: Secondary | ICD-10-CM

## 2020-02-10 MED ORDER — AMOXICILLIN-POT CLAVULANATE 875-125 MG PO TABS: 1.0000 | ORAL_TABLET | Freq: Two times a day (BID) | ORAL | 0 refills | Status: DC

## 2020-02-12 ENCOUNTER — Encounter: Payer: Self-pay | Admitting: Physician Assistant

## 2020-02-14 ENCOUNTER — Encounter: Payer: Self-pay | Admitting: Physician Assistant

## 2020-02-17 ENCOUNTER — Other Ambulatory Visit: Payer: Self-pay

## 2020-02-17 ENCOUNTER — Encounter: Payer: Self-pay | Admitting: Physician Assistant

## 2020-02-17 ENCOUNTER — Ambulatory Visit (INDEPENDENT_AMBULATORY_CARE_PROVIDER_SITE_OTHER): Payer: BC Managed Care – PPO | Admitting: Physician Assistant

## 2020-02-17 VITALS — BP 160/90 | HR 60 | Temp 98.4°F | Resp 16 | Ht 64.0 in | Wt 204.0 lb

## 2020-02-17 DIAGNOSIS — Z6835 Body mass index (BMI) 35.0-35.9, adult: Secondary | ICD-10-CM | POA: Diagnosis not present

## 2020-02-17 DIAGNOSIS — D508 Other iron deficiency anemias: Secondary | ICD-10-CM

## 2020-02-17 DIAGNOSIS — Z1159 Encounter for screening for other viral diseases: Secondary | ICD-10-CM

## 2020-02-17 DIAGNOSIS — Z0000 Encounter for general adult medical examination without abnormal findings: Secondary | ICD-10-CM | POA: Diagnosis not present

## 2020-02-17 DIAGNOSIS — J0140 Acute pansinusitis, unspecified: Secondary | ICD-10-CM | POA: Diagnosis not present

## 2020-02-17 DIAGNOSIS — E7800 Pure hypercholesterolemia, unspecified: Secondary | ICD-10-CM

## 2020-02-17 DIAGNOSIS — Z1239 Encounter for other screening for malignant neoplasm of breast: Secondary | ICD-10-CM

## 2020-02-17 MED ORDER — AMOXICILLIN-POT CLAVULANATE 875-125 MG PO TABS: 1.0000 | ORAL_TABLET | Freq: Two times a day (BID) | ORAL | 0 refills | Status: DC

## 2020-02-17 NOTE — Patient Instructions (Signed)

## 2020-02-17 NOTE — Progress Notes (Signed)
Complete physical exam   Patient: Monica Nelson   DOB: May 01, 1970   49 y.o. Female  MRN: [id number] Visit Date: 02/17/2020  Today's healthcare provider: Mar Daring, PA-C   Chief Complaint  Patient presents with  . Annual Exam   Subjective    Monica Nelson is a 49 y.o. female who presents today for a complete physical exam.   She reports consuming a general diet. The patient does not participate in regular exercise at present. She generally feels well. She reports sleeping well. She does not have additional problems to discuss today.   HPI  07/29/16-pap by GYN Mammogram scheduled for tomorrow 02/18/20   Past Medical History:  Diagnosis Date  . Allergic rhinitis   . Complex endometrial hyperplasia without atypia    currently treat with Mirena  . Hypertension    Past Surgical History:  Procedure Laterality Date  . BREAST BIOPSY Left 02/04/2014 x 2   negative  . ENDOMETRIAL BIOPSY     May 2010 complex hyperplasia, Nov 2011 prolif endometrium, 2014 prolif  . HYSTEROSCOPY WITH D & C  2010   Social History   Socioeconomic History  . Marital status: Single    Spouse name: Not on file  . Number of children: Not on file  . Years of education: Not on file  . Highest education level: Not on file  Occupational History  . Occupation: Development worker, international aid  Tobacco Use  . Smoking status: Never Smoker  . Smokeless tobacco: Never Used  Vaping Use  . Vaping Use: Never used  Substance and Sexual Activity  . Alcohol use: Yes    Comment: occasional  . Drug use: No  . Sexual activity: Yes    Birth control/protection: I.U.D.  Other Topics Concern  . Not on file  Social History Narrative   Also works in Management consultant office   Social Determinants of Health   Financial Resource Strain:   . Difficulty of Paying Living Expenses: Not on file  Food Insecurity:   . Worried About Charity fundraiser in the Last Year: Not on file  . Ran Out of Food  in the Last Year: Not on file  Transportation Needs:   . Lack of Transportation (Medical): Not on file  . Lack of Transportation (Non-Medical): Not on file  Physical Activity:   . Days of Exercise per Week: Not on file  . Minutes of Exercise per Session: Not on file  Stress:   . Feeling of Stress : Not on file  Social Connections:   . Frequency of Communication with Friends and Family: Not on file  . Frequency of Social Gatherings with Friends and Family: Not on file  . Attends Religious Services: Not on file  . Active Member of Clubs or Organizations: Not on file  . Attends Archivist Meetings: Not on file  . Marital Status: Not on file  Intimate Partner Violence:   . Fear of Current or Ex-Partner: Not on file  . Emotionally Abused: Not on file  . Physically Abused: Not on file  . Sexually Abused: Not on file   Family Status  Relation Name Status  . Mother  Alive  . Father  Deceased at age 46       bone cancer  . Neg Hx  (Not Specified)   Family History  Problem Relation Age of Onset  . Healthy Mother   . Thyroid disease Mother   . Bone cancer Father   .  Breast cancer Neg Hx    No Known Allergies  Patient Care Team: Mar Daring, PA-C as PCP - General (Family Medicine)   Medications: Outpatient Medications Prior to Visit  Medication Sig  . amoxicillin-clavulanate (AUGMENTIN) 875-125 MG tablet Take 1 tablet by mouth 2 (two) times daily.  . Ascorbic Acid (VITAMIN C) 1000 MG tablet Take 1,000 mg by mouth daily.   . Calcium Carbonate-Vitamin D 600-200 MG-UNIT TABS Take 1 tablet by mouth daily.  . cetirizine (ZYRTEC) 10 MG tablet Take 10 mg by mouth daily.  . fluticasone (FLONASE) 50 MCG/ACT nasal spray SPRAY 2 SPRAYS INTO EACH NOSTRIL EVERY DAY  . ibuprofen (ADVIL) 800 MG tablet TAKE 1 TABLET (800 MG TOTAL) BY MOUTH 2 (TWO) TIMES DAILY.  Marland Kitchen levonorgestrel (MIRENA, 52 MG,) 20 MCG/24HR IUD MIRENA, 20MCG/24HR (Intrauterine Intrauterine Device)  for 0  days  Quantity: 0.00;  Refills: 0   Ordered :19-Nov-2009  Ashley Royalty ;  Started 29-Jun-2009 Active Comments: DX: 626.2  . Misc Natural Products (OSTEO BI-FLEX ADV JOINT SHIELD) TABS Take 2 tablets by mouth daily.   . Red Yeast Rice 600 MG CAPS Take 1-2 capsules by mouth daily.   No facility-administered medications prior to visit.    Review of Systems  Constitutional: Negative.   HENT: Negative.   Eyes: Negative.   Respiratory: Negative.   Cardiovascular: Negative.   Gastrointestinal: Negative.   Endocrine: Negative.   Genitourinary: Negative.   Musculoskeletal: Negative.   Skin: Negative.   Allergic/Immunologic: Negative.   Neurological: Negative.   Hematological: Negative.   Psychiatric/Behavioral: Negative.     Last CBC Lab Results  Component Value Date   WBC 6.6 02/17/2020   HGB 14.8 02/17/2020   HCT 44.1 02/17/2020   MCV 90 02/17/2020   MCH 30.1 02/17/2020   RDW 12.9 02/17/2020   PLT 247 68/34/1962   Last metabolic panel Lab Results  Component Value Date   GLUCOSE 101 (H) 02/17/2020   NA 140 02/17/2020   K 4.5 02/17/2020   CL 103 02/17/2020   CO2 22 02/17/2020   BUN 16 02/17/2020   CREATININE 0.68 02/17/2020   GFRNONAA 103 02/17/2020   GFRAA 119 02/17/2020   CALCIUM 9.2 02/17/2020   PROT 6.6 02/17/2020   ALBUMIN 4.3 02/17/2020   LABGLOB 2.3 02/17/2020   AGRATIO 1.9 02/17/2020   BILITOT 0.7 02/17/2020   ALKPHOS 73 02/17/2020   AST 14 02/17/2020   ALT 12 02/17/2020      Objective    BP (!) 160/90 (BP Location: Left Arm, Patient Position: Sitting, Cuff Size: Large)   Pulse 60   Temp 98.4 F (36.9 C) (Oral)   Resp 16   Ht 5\' 4"  (1.626 m)   Wt 204 lb (92.5 kg)   BMI 35.02 kg/m  BP Readings from Last 3 Encounters:  02/17/20 (!) 160/90  05/17/19 (!) 120/98  05/02/19 132/86   Wt Readings from Last 3 Encounters:  02/17/20 204 lb (92.5 kg)  05/17/19 200 lb (90.7 kg)  05/02/19 199 lb (90.3 kg)      Physical Exam Vitals reviewed.   Constitutional:      General: She is not in acute distress.    Appearance: Normal appearance. She is well-developed and well-groomed. She is obese. She is not ill-appearing or diaphoretic.  HENT:     Head: Normocephalic and atraumatic.     Right Ear: Tympanic membrane, ear canal and external ear normal.     Left Ear: Tympanic membrane, ear canal and external  ear normal.     Nose: Nose normal.     Mouth/Throat:     Mouth: Mucous membranes are moist.     Pharynx: Oropharynx is clear. No oropharyngeal exudate or posterior oropharyngeal erythema.  Eyes:     General: No scleral icterus.       Right eye: No discharge.        Left eye: No discharge.     Extraocular Movements: Extraocular movements intact.     Conjunctiva/sclera: Conjunctivae normal.     Pupils: Pupils are equal, round, and reactive to light.  Neck:     Thyroid: No thyromegaly.     Vascular: No carotid bruit or JVD.     Trachea: No tracheal deviation.  Cardiovascular:     Rate and Rhythm: Normal rate and regular rhythm.     Pulses: Normal pulses.     Heart sounds: Normal heart sounds. No murmur heard.  No friction rub. No gallop.   Pulmonary:     Effort: Pulmonary effort is normal. No respiratory distress.     Breath sounds: Normal breath sounds. No wheezing or rales.  Chest:     Chest wall: No tenderness.     Breasts:        Right: Normal.        Left: Normal.  Abdominal:     General: Abdomen is flat. Bowel sounds are normal. There is no distension.     Palpations: Abdomen is soft. There is no mass.     Tenderness: There is no abdominal tenderness. There is no guarding or rebound.  Musculoskeletal:        General: No tenderness. Normal range of motion.     Cervical back: Normal range of motion and neck supple. No tenderness.     Right lower leg: No edema.     Left lower leg: No edema.  Lymphadenopathy:     Cervical: No cervical adenopathy.  Skin:    General: Skin is warm and dry.     Capillary Refill:  Capillary refill takes less than 2 seconds.     Findings: No rash.  Neurological:     General: No focal deficit present.     Mental Status: She is alert and oriented to person, place, and time. Mental status is at baseline.  Psychiatric:        Mood and Affect: Mood normal.        Behavior: Behavior normal. Behavior is cooperative.        Thought Content: Thought content normal.        Judgment: Judgment normal.     Last depression screening scores PHQ 2/9 Scores 02/17/2020 02/11/2019 02/07/2018  PHQ - 2 Score 0 0 0  PHQ- 9 Score - 0 -   Last fall risk screening Fall Risk  02/17/2020  Falls in the past year? 0  Number falls in past yr: 0  Injury with Fall? 0  Risk for fall due to : No Fall Risks  Follow up Falls evaluation completed   Last Audit-C alcohol use screening Alcohol Use Disorder Test (AUDIT) 02/17/2020  1. How often do you have a drink containing alcohol? 2  2. How many drinks containing alcohol do you have on a typical day when you are drinking? 0  3. How often do you have six or more drinks on one occasion? 0  AUDIT-C Score 2  Alcohol Brief Interventions/Follow-up AUDIT Score <7 follow-up not indicated   A score of 3 or more in women,  and 4 or more in men indicates increased risk for alcohol abuse, EXCEPT if all of the points are from question 1   No results found for any visits on 02/17/20.  Assessment & Plan    Routine Health Maintenance and Physical Exam  Exercise Activities and Dietary recommendations Goals    . Exercise 150 minutes per week (moderate activity)       Immunization History  Administered Date(s) Administered  . Influenza,inj,Quad PF,6+ Mos 03/07/2015, 02/07/2018, 12/28/2018, 01/07/2020  . Td 05/07/2018  . Tdap 07/29/2005    Health Maintenance  Topic Date Due  . Hepatitis C Screening  Never done  . COVID-19 Vaccine (1) Never done  . PAP SMEAR-Modifier  07/29/2021  . TETANUS/TDAP  05/07/2028  . INFLUENZA VACCINE  Completed  .  HIV Screening  Completed    Discussed health benefits of physical activity, and encouraged her to engage in regular exercise appropriate for her age and condition.  1. Annual physical exam Normal physical exam today. Will check labs as below and f/u pending lab results. If labs are stable and WNL she will not need to have these rechecked for one year at her next annual physical exam. She is to call the office in the meantime if she has any acute issue, questions or concerns.  2. Encounter for breast cancer screening using non-mammogram modality Breast exam today was normal. There is no family history of breast cancer. She does perform regular self breast exams. Mammogram was ordered as below. Information for [facility] clinic was given to patient so she may schedule her mammogram at her convenience.  3. Encounter for hepatitis C screening test for low risk patient Will check labs as below and f/u pending results. - Hepatitis C antibody  4. Acute non-recurrent pansinusitis Worsening symptoms that have not responded to OTC medications. Will give augmentin as below. Continue allergy medications. Stay well hydrated and get plenty of rest. Call if no symptom improvement or if symptoms worsen. - amoxicillin-clavulanate (AUGMENTIN) 875-125 MG tablet; Take 1 tablet by mouth 2 (two) times daily.  Dispense: 20 tablet; Refill: 0  5. Other iron deficiency anemia Will check labs as below and f/u pending results. - CBC with Differential/Platelet  6. Hypercholesterolemia On red yeast rice. Diet controlled. Will check labs as below and f/u pending results. - CBC with Differential/Platelet - Comprehensive metabolic panel - Hemoglobin A1c - Lipid panel  7. Class 2 severe obesity due to excess calories with serious comorbidity and body mass index (BMI) of 35.0 to 35.9 in adult [facility]) Counseled patient on healthy lifestyle modifications including dieting and exercise.  - CBC with  Differential/Platelet - Comprehensive metabolic panel - Hemoglobin A1c - Lipid panel - TSH   No follow-ups on file.     Reynolds Bowl, PA-C, have reviewed all documentation for this visit. The documentation on 02/18/20 for the exam, diagnosis, procedures, and orders are all accurate and complete.   Rubye Beach  [facility] [phone] (phone) [phone] (fax)  [place]

## 2020-02-18 ENCOUNTER — Encounter: Payer: Self-pay | Admitting: Physician Assistant

## 2020-02-18 LAB — COMPREHENSIVE METABOLIC PANEL
ALT: 12 IU/L (ref 0–32)
AST: 14 IU/L (ref 0–40)
Albumin/Globulin Ratio: 1.9 (ref 1.2–2.2)
Albumin: 4.3 g/dL (ref 3.8–4.8)
Alkaline Phosphatase: 73 IU/L (ref 44–121)
BUN/Creatinine Ratio: 24 — ABNORMAL HIGH (ref 9–23)
BUN: 16 mg/dL (ref 6–24)
Bilirubin Total: 0.7 mg/dL (ref 0.0–1.2)
CO2: 22 mmol/L (ref 20–29)
Calcium: 9.2 mg/dL (ref 8.7–10.2)
Chloride: 103 mmol/L (ref 96–106)
Creatinine, Ser: 0.68 mg/dL (ref 0.57–1.00)
GFR calc Af Amer: 119 mL/min/{1.73_m2} (ref >59)
GFR calc non Af Amer: 103 mL/min/{1.73_m2} (ref >59)
Globulin, Total: 2.3 g/dL (ref 1.5–4.5)
Glucose: 101 mg/dL — ABNORMAL HIGH (ref 65–99)
Potassium: 4.5 mmol/L (ref 3.5–5.2)
Sodium: 140 mmol/L (ref 134–144)
Total Protein: 6.6 g/dL (ref 6.0–8.5)

## 2020-02-18 LAB — HEMOGLOBIN A1C
Est. average glucose Bld gHb Est-mCnc: 117 mg/dL
Hgb A1c MFr Bld: 5.7 % — ABNORMAL HIGH (ref 4.8–5.6)

## 2020-02-18 LAB — CBC WITH DIFFERENTIAL/PLATELET
Basophils Absolute: 0.1 10*3/uL (ref 0.0–0.2)
Basos: 1 %
EOS (ABSOLUTE): 0.5 10*3/uL — ABNORMAL HIGH (ref 0.0–0.4)
Eos: 7 %
Hematocrit: 44.1 % (ref 34.0–46.6)
Hemoglobin: 14.8 g/dL (ref 11.1–15.9)
Immature Grans (Abs): 0 10*3/uL (ref 0.0–0.1)
Immature Granulocytes: 0 %
Lymphocytes Absolute: 1.8 10*3/uL (ref 0.7–3.1)
Lymphs: 27 %
MCH: 30.1 pg (ref 26.6–33.0)
MCHC: 33.6 g/dL (ref 31.5–35.7)
MCV: 90 fL (ref 79–97)
Monocytes Absolute: 0.5 10*3/uL (ref 0.1–0.9)
Monocytes: 8 %
Neutrophils Absolute: 3.8 10*3/uL (ref 1.4–7.0)
Neutrophils: 57 %
Platelets: 247 10*3/uL (ref 150–450)
RBC: 4.92 x10E6/uL (ref 3.77–5.28)
RDW: 12.9 % (ref 11.7–15.4)
WBC: 6.6 10*3/uL (ref 3.4–10.8)

## 2020-02-18 LAB — LIPID PANEL
Chol/HDL Ratio: 3.9 ratio (ref 0.0–4.4)
Cholesterol, Total: 204 mg/dL — ABNORMAL HIGH (ref 100–199)
HDL: 52 mg/dL (ref >39)
LDL Chol Calc (NIH): 135 mg/dL — ABNORMAL HIGH (ref 0–99)
Triglycerides: 93 mg/dL (ref 0–149)
VLDL Cholesterol Cal: 17 mg/dL (ref 5–40)

## 2020-02-18 LAB — HEPATITIS C ANTIBODY: Hep C Virus Ab: <0.1 s/co ratio (ref 0.0–0.9)

## 2020-02-18 LAB — TSH: TSH: 2.4 u[IU]/mL (ref 0.450–4.500)

## 2020-02-18 NOTE — Telephone Encounter (Signed)
I replied back to the patient about her labs results and total cholesterol but she is asking if she will be able to get more Augmentin

## 2020-04-01 ENCOUNTER — Other Ambulatory Visit: Payer: Self-pay

## 2020-04-01 ENCOUNTER — Ambulatory Visit
Admission: RE | Admit: 2020-04-01 | Discharge: 2020-04-01 | Disposition: A | Discharge status: Home / Self Care | Payer: BC Managed Care – PPO | Source: Ambulatory Visit | Attending: Physician Assistant | Admitting: Physician Assistant

## 2020-04-01 DIAGNOSIS — Z1231 Encounter for screening mammogram for malignant neoplasm of breast: Secondary | ICD-10-CM | POA: Diagnosis not present

## 2020-05-04 ENCOUNTER — Ambulatory Visit (INDEPENDENT_AMBULATORY_CARE_PROVIDER_SITE_OTHER): Payer: BC Managed Care – PPO | Admitting: Obstetrics and Gynecology

## 2020-05-04 ENCOUNTER — Other Ambulatory Visit (HOSPITAL_COMMUNITY)
Admission: RE | Admit: 2020-05-04 | Discharge: 2020-05-04 | Disposition: A | Discharge status: Home / Self Care | Payer: BC Managed Care – PPO | Source: Ambulatory Visit | Attending: Obstetrics and Gynecology | Admitting: Obstetrics and Gynecology

## 2020-05-04 ENCOUNTER — Encounter: Payer: Self-pay | Admitting: Obstetrics and Gynecology

## 2020-05-04 ENCOUNTER — Other Ambulatory Visit: Payer: Self-pay

## 2020-05-04 VITALS — BP 132/72 | Ht 64.0 in | Wt 203.4 lb

## 2020-05-04 DIAGNOSIS — Z1239 Encounter for other screening for malignant neoplasm of breast: Secondary | ICD-10-CM

## 2020-05-04 DIAGNOSIS — Z0000 Encounter for general adult medical examination without abnormal findings: Secondary | ICD-10-CM

## 2020-05-04 DIAGNOSIS — Z01419 Encounter for gynecological examination (general) (routine) without abnormal findings: Secondary | ICD-10-CM | POA: Diagnosis not present

## 2020-05-04 DIAGNOSIS — Z8742 Personal history of other diseases of the female genital tract: Secondary | ICD-10-CM | POA: Diagnosis not present

## 2020-05-04 DIAGNOSIS — Z30432 Encounter for removal of intrauterine contraceptive device: Secondary | ICD-10-CM

## 2020-05-04 DIAGNOSIS — Z124 Encounter for screening for malignant neoplasm of cervix: Secondary | ICD-10-CM | POA: Insufficient documentation

## 2020-05-04 DIAGNOSIS — Z30430 Encounter for insertion of intrauterine contraceptive device: Secondary | ICD-10-CM

## 2020-05-04 NOTE — Patient Instructions (Signed)
Institute [employer] Recommended Dietary Allowances for Calcium and Vitamin D  Age (yr) Calcium Recommended Dietary Allowance (mg/day) Vitamin D Recommended Dietary Allowance (international units/day)  9-18 1,300 600  19-50 1,000 600  51-70 1,200 600  71 and older 1,200 800  Data from [employer]. Dietary reference intakes: calcium, vitamin D. [place], [place]: National Academies [employer]; 2011.    Exercising to Stay Healthy To become healthy and stay healthy, it is recommended that you do moderate-intensity and vigorous-intensity exercise. You can tell that you are exercising at a moderate intensity if your heart starts beating faster and you start breathing faster but can still hold a conversation. You can tell that you are exercising at a vigorous intensity if you are breathing much harder and faster and cannot hold a conversation while exercising. Exercising regularly is important. It has many health benefits, such as:  Improving overall fitness, flexibility, and endurance.  Increasing bone density.  Helping with weight control.  Decreasing body fat.  Increasing muscle strength.  Reducing stress and tension.  Improving overall health. How often should I exercise? Choose an activity that you enjoy, and set realistic goals. Your health care provider can help you make an activity plan that works for you. Exercise regularly as told by your health care provider. This may include:  Doing strength training two times a week, such as: ? Lifting weights. ? Using resistance bands. ? Push-ups. ? Sit-ups. ? Yoga.  Doing a certain intensity of exercise for a given amount of time. Choose from these options: ? A total of 150 minutes of moderate-intensity exercise every week. ? A total of 75 minutes of vigorous-intensity exercise every week. ? A mix of moderate-intensity and vigorous-intensity exercise every week. Children, pregnant women, people who have not exercised  regularly, people who are overweight, and older adults may need to talk with a health care provider about what activities are safe to do. If you have a medical condition, be sure to talk with your health care provider before you start a new exercise program. What are some exercise ideas? Moderate-intensity exercise ideas include:  Walking 1 mile (1.6 km) in about 15 minutes.  Biking.  Hiking.  Golfing.  Dancing.  Water aerobics. Vigorous-intensity exercise ideas include:  Walking 4.5 miles (7.2 km) or more in about 1 hour.  Jogging or running 5 miles (8 km) in about 1 hour.  Biking 10 miles (16.1 km) or more in about 1 hour.  Lap swimming.  Roller-skating or in-line skating.  Cross-country skiing.  Vigorous competitive sports, such as football, basketball, and soccer.  Jumping rope.  Aerobic dancing.   What are some everyday activities that can help me to get exercise?  Yard work, such as: ? Pushing a lawn mower. ? Raking and bagging leaves.  Washing your car.  Pushing a stroller.  Shoveling snow.  Gardening.  Washing windows or floors. How can I be more active in my day-to-day activities?  Use stairs instead of an elevator.  Take a walk during your lunch break.  If you drive, park your car farther away from your work or school.  If you take public transportation, get off one stop early and walk the rest of the way.  Stand up or walk around during all of your indoor phone calls.  Get up, stretch, and walk around every 30 minutes throughout the day.  Enjoy exercise with a friend. Support to continue exercising will help you keep a regular routine of activity. What guidelines   can I follow while exercising?  Before you start a new exercise program, talk with your health care provider.  Do not exercise so much that you hurt yourself, feel dizzy, or get very short of breath.  Wear comfortable clothes and wear shoes with good support.  Drink plenty of  water while you exercise to prevent dehydration or heat stroke.  Work out until your breathing and your heartbeat get faster. Where to find more information  [place] Department of Health and Human Services: www.hhs.gov  Centers for Disease Control and Prevention (CDC): www.cdc.gov Summary  Exercising regularly is important. It will improve your overall fitness, flexibility, and endurance.  Regular exercise also will improve your overall health. It can help you control your weight, reduce stress, and improve your bone density.  Do not exercise so much that you hurt yourself, feel dizzy, or get very short of breath.  Before you start a new exercise program, talk with your health care provider. This information is not intended to replace advice given to you by your health care provider. Make sure you discuss any questions you have with your health care provider. Document Revised: 02/17/2017 Document Reviewed: 01/26/2017 Elsevier Patient Education  2021 Else[employer] Inc.   Budget-Friendly Healthy Eating There are many ways to save money at the grocery store and continue to eat healthy. You can be successful if you:  Plan meals according to your budget.  Make a grocery list and only purchase food according to your grocery list.  Prepare food yourself at home. What are tips for following this plan? Reading food labels  Compare food labels between brand name foods and the store brand. Often the nutritional value is the same, but the store brand is lower cost.  Look for products that do not have added sugar, fat, or salt (sodium). These often cost the same but are healthier for you. Products may be labeled as: ? Sugar-free. ? Nonfat. ? Low-fat. ? Sodium-free. ? Low-sodium.  Look for lean ground beef labeled as at least 92% lean and 8% fat. Shopping  Buy only the items on your grocery list and go only to the areas of the store that have the items on your list.  Use coupons only for  foods and brands you normally buy. Avoid buying items you wouldn't normally buy simply because they are on sale.  Check online and in newspapers for weekly deals.  Buy healthy items from the bulk bins when available, such as herbs, spices, flour, pasta, nuts, and dried fruit.  Buy fruits and vegetables that are in season. Prices are usually lower on in-season produce.  Look at the unit price on the price tag. Use it to compare different brands and sizes to find out which item is the best deal.  Choose healthy items that are often low-cost, such as carrots, potatoes, apples, bananas, and oranges. Dried or canned beans are a low-cost protein source.  Buy in bulk and freeze extra food. Items you can buy in bulk include meats, fish, poultry, frozen fruits, and frozen vegetables.  Avoid buying "ready-to-eat" foods, such as pre-cut fruits and vegetables and pre-made salads.  If possible, shop around to discover where you can find the best prices. Consider other retailers such as dollar stores, larger wholesale stores, local fruit and vegetable stands, and farmers markets.  Do not shop when you are hungry. If you shop while hungry, it may be hard to stick to your list and budget.  Resist impulse buying. Use your grocery   list as your official plan for the week.  Buy a variety of vegetables and fruits by purchasing fresh, frozen, and canned items.  Look at the top and bottom shelves for deals. Foods at eye level (eye level of an adult or child) are usually more expensive.  Be efficient with your time when shopping. The more time you spend at the store, the more money you are likely to spend.  To save money when choosing more expensive foods like meats and dairy: ? Choose cheaper cuts of meat, such as bone-in chicken thighs and drumsticks instead of skinless and boneless chicken. When you are ready to prepare the chicken, you can remove the skin yourself to make it healthier. ? Choose lean meats  like chicken or turkey instead of beef. ? Choose canned seafood, such as tuna, salmon, or sardines. ? Buy eggs as a low-cost source of protein. ? Buy dried beans and peas, such as lentils, split peas, or kidney beans instead of meats. Dried beans and peas are a good alternative source of protein. ? Buy the larger tubs of yogurt instead of individual-sized containers.  Choose water instead of sodas and other sweetened beverages.  Avoid buying chips, cookies, and other "junk food." These items are usually expensive and not healthy.   Cooking  Make extra food and freeze the extras in meal-sized containers or in individual portions for fast meals and snacks.  Pre-cook on days when you have extra time to prepare meals in advance. You can keep these meals in the fridge or freezer and reheat for a quick meal.  When you come home from the grocery store, wash, peel, and cut fruits and vegetables so they are ready to use and eat. This will help reduce food waste. Meal planning  Do not eat out or get fast food. Prepare food at home.  Make a grocery list and make sure to bring it with you to the store. If you have a smart phone, you could use your phone to create your shopping list.  Plan meals and snacks according to a grocery list and budget you create.  Use leftovers in your meal plan for the week.  Look for recipes where you can cook once and make enough food for two meals.  Prepare budget-friendly types of meals like stews, casseroles, and stir-fry dishes.  Try some meatless meals or try "no cook" meals like salads.  Make sure that half your plate is filled with fruits or vegetables. Choose from fresh, frozen, or canned fruits and vegetables. If eating canned, remember to rinse them before eating. This will remove any excess salt added for packaging. Summary  Eating healthy on a budget is possible if you plan your meals according to your budget, purchase according to your budget and  grocery list, and prepare food yourself.  Tips for buying more food on a limited budget include buying generic brands, using coupons only for foods you normally buy, and buying healthy items from the bulk bins when available.  Tips for buying cheaper food to replace expensive food include choosing cheaper, lean cuts of meat, and buying dried beans and peas. This information is not intended to replace advice given to you by your health care provider. Make sure you discuss any questions you have with your health care provider. Document Revised: 12/19/2019 Document Reviewed: 12/19/2019 Elsevier Patient Education  2021 [employer] Inc.   Bone Health Bones protect organs, store calcium, anchor muscles, and support the whole body. Keeping your bones   strong is important, especially as you get older. You can take actions to help keep your bones strong and healthy. Why is keeping my bones healthy important? Keeping your bones healthy is important because your body constantly replaces bone cells. Cells get old, and new cells take their place. As we age, we lose bone cells because the body may not be able to make enough new cells to replace the old cells. The amount of bone cells and bone tissue you have is referred to as bone mass. The higher your bone mass, the stronger your bones. The aging process leads to an overall loss of bone mass in the body, which can increase the likelihood of:  Joint pain and stiffness.  Broken bones.  A condition in which the bones become weak and brittle (osteoporosis). A large decline in bone mass occurs in older adults. In women, it occurs about the time of menopause.   What actions can I take to keep my bones healthy? Good health habits are important for maintaining healthy bones. This includes eating nutritious foods and exercising regularly. To have healthy bones, you need to get enough of the right minerals and vitamins. Most nutrition experts recommend getting these  nutrients from the foods that you eat. In some cases, taking supplements may also be recommended. Doing certain types of exercise is also important for bone health. What are the nutritional recommendations for healthy bones? Eating a well-balanced diet with plenty of calcium and vitamin D will help to protect your bones. Nutritional recommendations vary from person to person. Ask your health care provider what is healthy for you. Here are some general guidelines. Get enough calcium Calcium is the most important (essential) mineral for bone health. Most people can get enough calcium from their diet, but supplements may be recommended for people who are at risk for osteoporosis. Good sources of calcium include:  Dairy products, such as low-fat or nonfat milk, cheese, and yogurt.  Dark green leafy vegetables, such as bok choy and broccoli.  Calcium-fortified foods, such as orange juice, cereal, bread, soy beverages, and tofu products.  Nuts, such as almonds. Follow these recommended amounts for daily calcium intake:  Children, age 1-3: 700 mg.  Children, age 4-8: 1,000 mg.  Children, age 9-13: 1,300 mg.  Teens, age 14-18: 1,300 mg.  Adults, age 19-50: 1,000 mg.  Adults, age 51-70: ? Men: 1,000 mg. ? Women: 1,200 mg.  Adults, age 71 or older: 1,200 mg.  Pregnant and breastfeeding females: ? Teens: 1,300 mg. ? Adults: 1,000 mg. Get enough vitamin D Vitamin D is the most essential vitamin for bone health. It helps the body absorb calcium. Sunlight stimulates the skin to make vitamin D, so be sure to get enough sunlight. If you live in a cold climate or you do not get outside often, your health care provider may recommend that you take vitamin D supplements. Good sources of vitamin D in your diet include:  Egg yolks.  Saltwater fish.  Milk and cereal fortified with vitamin D. Follow these recommended amounts for daily vitamin D intake:  Children and teens, age 1-18: 600  international units.  Adults, age 50 or younger: 400-800 international units.  Adults, age 51 or older: 800-1,000 international units. Get other important nutrients Other nutrients that are important for bone health include:  Phosphorus. This mineral is found in meat, poultry, dairy foods, nuts, and legumes. The recommended daily intake for adult men and adult women is 700 mg.  Magnesium. This mineral   is found in seeds, nuts, dark green vegetables, and legumes. The recommended daily intake for adult men is 400-420 mg. For adult women, it is 310-320 mg.  Vitamin K. This vitamin is found in green leafy vegetables. The recommended daily intake is 120 mg for adult men and 90 mg for adult women.   What type of physical activity is best for building and maintaining healthy bones? Weight-bearing and strength-building activities are important for building and maintaining healthy bones. Weight-bearing activities cause muscles and bones to work against gravity. Strength-building activities increase the strength of the muscles that support bones. Weight-bearing and muscle-building activities include:  Walking and hiking.  Jogging and running.  Dancing.  Gym exercises.  Lifting weights.  Tennis and racquetball.  Climbing stairs.  Aerobics. Adults should get at least 30 minutes of moderate physical activity on most days. Children should get at least 60 minutes of moderate physical activity on most days. Ask your health care provider what type of exercise is best for you.   How can I find out if my bone mass is low? Bone mass can be measured with an X-ray test called a bone mineral density (BMD) test. This test is recommended for all women who are age 65 or older. It may also be recommended for:  Men who are age 70 or older.  People who are at risk for osteoporosis because of: ? Having bones that break easily. ? Having a long-term disease that weakens bones, such as kidney disease or  rheumatoid arthritis. ? Having menopause earlier than normal. ? Taking medicine that weakens bones, such as steroids, thyroid hormones, or hormone treatment for breast cancer or prostate cancer. ? Smoking. ? Drinking three or more alcoholic drinks a day. If you find that you have a low bone mass, you may be able to prevent osteoporosis or further bone loss by changing your diet and lifestyle. Where can I find more information? For more information, check out the following websites:  National Osteoporosis Foundation: www.nof.org/patients  National Institutes of Health: www.bones.nih.gov  International Osteoporosis Foundation: www.iofbonehealth.org Summary  The aging process leads to an overall loss of bone mass in the body, which can increase the likelihood of broken bones and osteoporosis.  Eating a well-balanced diet with plenty of calcium and vitamin D will help to protect your bones.  Weight-bearing and strength-building activities are also important for building and maintaining strong bones.  Bone mass can be measured with an X-ray test called a bone mineral density (BMD) test. This information is not intended to replace advice given to you by your health care provider. Make sure you discuss any questions you have with your health care provider. Document Revised: 04/03/2017 Document Reviewed: 04/03/2017 Elsevier Patient Education  2021 [employer].   

## 2020-05-04 NOTE — Progress Notes (Signed)
Gynecology Annual Exam  PCP: Mar Daring, PA-C  Chief Complaint:  Chief Complaint  Patient presents with  . Gynecologic Exam    Annual Exam    History of Present Illness: Patient is a 50 y.o. G0P0000 presents for annual exam. The patient has no complaints today.   LMP: No LMP recorded. (Menstrual status: IUD). No periods but occassional spotting throughout the year  Heavy Menses: no Intermenstrual Bleeding: no Dysmenorrhea: no  The patient does perform self breast exams.  There is no notable family history of breast or ovarian cancer in her family.  The patient has regular exercise: walking 2-3 times a week  The patient denies current symptoms of depression.   Review of Systems: ROS  Past Medical History:  Past Medical History:  Diagnosis Date  . Allergic rhinitis   . Complex endometrial hyperplasia without atypia    currently treat with Mirena  . Hypertension     Past Surgical History:  Past Surgical History:  Procedure Laterality Date  . BREAST BIOPSY Left 02/04/2014 x 2   negative  . ENDOMETRIAL BIOPSY     May 2010 complex hyperplasia, Nov 2011 prolif endometrium, 2014 prolif  . HYSTEROSCOPY WITH D & C  2010    Gynecologic History:  No LMP recorded. (Menstrual status: IUD). Menarche: 16  History of fibroids, polyps, or ovarian cysts? : no History of PCOS? denies Hstory of Endometriosis? denies History of abnormal pap smears? denies Have you had any sexually transmitted infections in the past?  denies    Last Pap: Results were: 2018 NIL    She is  sexually active with men.   She denies dyspareunia. She denies postcoital bleeding.    Obstetric History: G0P0000  Family History:  Family History  Problem Relation Age of Onset  . Healthy Mother   . Thyroid disease Mother   . Bone cancer Father   . Breast cancer Neg Hx     Social History:  Social History   Socioeconomic History  . Marital status: Single    Spouse name: Not on  file  . Number of children: Not on file  . Years of education: Not on file  . Highest education level: Not on file  Occupational History  . Occupation: Development worker, international aid  Tobacco Use  . Smoking status: Never Smoker  . Smokeless tobacco: Never Used  Vaping Use  . Vaping Use: Never used  Substance and Sexual Activity  . Alcohol use: Yes    Comment: occasional  . Drug use: No  . Sexual activity: Yes    Birth control/protection: I.U.D.  Other Topics Concern  . Not on file  Social History Narrative   Also works in Management consultant office   Social Determinants of Health   Financial Resource Strain: Not on file  Food Insecurity: Not on file  Transportation Needs: Not on file  Physical Activity: Not on file  Stress: Not on file  Social Connections: Not on file  Intimate Partner Violence: Not on file    Allergies:  No Known Allergies  Medications: Prior to Admission medications   Medication Sig Start Date End Date Taking? Authorizing Provider  amoxicillin-clavulanate (AUGMENTIN) 875-125 MG tablet Take 1 tablet by mouth 2 (two) times daily. 02/17/20  Yes Mar Daring, PA-C  Ascorbic Acid (VITAMIN C) 1000 MG tablet Take 1,000 mg by mouth daily.    Yes [provider]  Calcium Carbonate-Vitamin D 600-200 MG-UNIT TABS Take 1 tablet by mouth daily.  Yes [provider]  cetirizine (ZYRTEC) 10 MG tablet Take 10 mg by mouth daily.   Yes [provider]  fluticasone (FLONASE) 50 MCG/ACT nasal spray SPRAY 2 SPRAYS INTO EACH NOSTRIL EVERY DAY 09/24/18  Yes Fenton Malling M, PA-C  ibuprofen (ADVIL) 800 MG tablet TAKE 1 TABLET (800 MG TOTAL) BY MOUTH 2 (TWO) TIMES DAILY. 05/08/19  Yes Mar Daring, PA-C  levonorgestrel (MIRENA) 20 MCG/24HR IUD MIRENA, 20MCG/24HR (Intrauterine Intrauterine Device)  for 0 days  Quantity: 0.00;  Refills: 0   Ordered :19-Nov-2009  Ashley Royalty ;  Started 29-Jun-2009 Active Comments: DX: 626.2 06/29/09  Yes  [provider]  Misc Natural Products (OSTEO BI-FLEX ADV JOINT SHIELD) TABS Take 2 tablets by mouth daily.    Yes [provider]  Red Yeast Rice 600 MG CAPS Take 1-2 capsules by mouth daily.   Yes [provider]    Physical Exam Vitals: Blood pressure 132/72, height 5\' 4"  (1.626 m), weight 203 lb 6.4 oz (92.3 kg).  Physical Exam Constitutional:      Appearance: She is well-developed.  Genitourinary:     Vagina and uterus normal.     There is no lesion on the right labia.     There is no lesion on the left labia.    No lesions in the vagina.      Right Adnexa: no mass present.    Left Adnexa: no mass present.    No cervical motion tenderness.  Breasts:     Right: No inverted nipple, mass, nipple discharge or skin change.     Left: No inverted nipple, mass, nipple discharge or skin change.    HENT:     Head: Normocephalic and atraumatic.  Eyes:     Extraocular Movements: EOM normal.  Neck:     Thyroid: No thyromegaly.  Cardiovascular:     Rate and Rhythm: Normal rate and regular rhythm.     Heart sounds: Normal heart sounds.  Pulmonary:     Effort: Pulmonary effort is normal.     Breath sounds: Normal breath sounds.  Abdominal:     General: Bowel sounds are normal. There is no distension.     Palpations: Abdomen is soft. There is no mass.  Musculoskeletal:     Cervical back: Neck supple.  Neurological:     Mental Status: She is alert and oriented to person, place, and time.  Skin:    General: Skin is warm and dry.  Psychiatric:        Mood and Affect: Mood and affect normal.        Behavior: Behavior normal.        Thought Content: Thought content normal.        Judgment: Judgment normal.  Vitals reviewed. Exam conducted with a chaperone present.     IUD placed approximately 6 years ago. Since that time, she states that she has been happy with th e IUD. She wishes to have her IUD removed and replaced with a new  IUD.   Discussed  risks of irregular bleeding, cramping, infection, malpositioning or misplacement of the IUD outside the uterus which may require further procedure such as laparoscopy, risk of failure <1%. Time out was performed.   Patient identified, informed consent performed, consent signed.     A bimanual exam showed the uterus to be anteverted.  Speculum placed in the vagina.  Cervix visualized.    IUD Removal Strings of IUD identified and grasped.  IUD removed  without problem.  Pt tolerated this well.  IUD noted to be intact.  IUD Placement Cleaned with Betadine x 2.   Grasped anteriorly with a single tooth tenaculum.  Uterus sounded to 7 cm.  EMB performed.  Mirena  IUD placed per manufacturer's recommendations.  Strings trimmed to 3 cm. Tenaculum was removed, good hemostasis noted.  Patient tolerated procedure well.   Patient was given post-procedure instructions. Patient was also asked to check IUD strings periodically and follow up in 4 weeks for IUD check.  Female chaperone present for pelvic and breast  portions of the physical exam  Assessment: 50 y.o. G0P0000 routine annual exam  Plan: Problem List Items Addressed This Visit   None   Visit Diagnoses    Encounter for annual routine gynecological examination    -  Primary   Health maintenance examination       Cervical cancer screening       Encounter for gynecological examination without abnormal finding       Encounter for screening breast examination          1) Mammogram - recommend yearly screening mammogram.  Mammogram Is up to date  2) STI screening was offered and declined.  3) ASCCP guidelines and rational discussed.  Patient opts for every 3 years screening interval  4) Contraception - IUD in place for history of hyperplasia. IUD 50 years old. Patient having spotting. IUD exchanged today and EMB performed for monitoring.   5) Colonoscopy -- discussed, patient would like to wait until 50 and start cologuard testing at that  time.  6) Routine healthcare maintenance including cholesterol, diabetes screening discussed managed by PCP   Adrian Prows MD, [place], [facility] 05/04/2020 9:28 AM

## 2020-05-05 LAB — SURGICAL PATHOLOGY

## 2020-05-07 LAB — CYTOLOGY - PAP
Comment: NEGATIVE
Diagnosis: NEGATIVE
High risk HPV: NEGATIVE

## 2020-05-27 ENCOUNTER — Other Ambulatory Visit: Payer: Self-pay | Admitting: Physician Assistant

## 2020-05-27 DIAGNOSIS — M2550 Pain in unspecified joint: Secondary | ICD-10-CM

## 2020-05-27 NOTE — Telephone Encounter (Signed)
Requested Prescriptions  Pending Prescriptions Disp Refills  . ibuprofen (ADVIL) 800 MG tablet [Pharmacy Med Name: IBUPROFEN 800 MG TABLET] 90 tablet 2    Sig: TAKE 1 TABLET (800 MG TOTAL) BY MOUTH 2 (TWO) TIMES DAILY.     Analgesics:  NSAIDS Passed - 05/27/2020 10:58 PM      Passed - Cr in normal range and within 360 days    Creat  Date Value Ref Range Status  01/09/2017 0.83 0.50 - 1.10 mg/dL Final   Creatinine, Ser  Date Value Ref Range Status  02/17/2020 0.68 0.57 - 1.00 mg/dL Final         Passed - HGB in normal range and within 360 days    Hemoglobin  Date Value Ref Range Status  02/17/2020 14.8 11.1 - 15.9 g/dL Final         Passed - Patient is not pregnant      Passed - Valid encounter within last 12 months    Recent Outpatient Visits          3 months ago Annual physical exam   [place], [place]   11 months ago Acute non-recurrent pansinusitis   [facility] [place], Clearnce Sorrel, [place]   1 year ago Annual physical exam   [place], Clearnce Sorrel, [place]   2 years ago Cat bite of right hand, initial encounter   [facility] [place], Clearnce Sorrel, [place]   2 years ago Annual physical exam   [place], [place]      Future Appointments            In 8 months [place], Clearnce Sorrel, PA-C [employer], [place]

## 2020-06-01 ENCOUNTER — Ambulatory Visit: Payer: BC Managed Care – PPO | Admitting: Obstetrics and Gynecology

## 2020-06-09 ENCOUNTER — Ambulatory Visit: Payer: BC Managed Care – PPO | Admitting: Obstetrics and Gynecology

## 2020-06-14 ENCOUNTER — Encounter: Payer: Self-pay | Admitting: Physician Assistant

## 2020-06-16 ENCOUNTER — Encounter: Payer: Self-pay | Admitting: Obstetrics and Gynecology

## 2020-06-16 ENCOUNTER — Ambulatory Visit (INDEPENDENT_AMBULATORY_CARE_PROVIDER_SITE_OTHER): Payer: BC Managed Care – PPO | Admitting: Obstetrics and Gynecology

## 2020-06-16 ENCOUNTER — Other Ambulatory Visit: Payer: Self-pay

## 2020-06-16 VITALS — BP 120/70 | Ht 64.0 in | Wt 209.4 lb

## 2020-06-16 DIAGNOSIS — Z30431 Encounter for routine checking of intrauterine contraceptive device: Secondary | ICD-10-CM

## 2020-06-16 NOTE — Progress Notes (Signed)
  History of Present Illness:  Monica Nelson is a 50 y.o. that had a Mirena IUD placed approximately 4 weeks ago. Since that time, she states that she has had no bleeding and pain  History of complex endometrial hyperplasia without atypia, currently being treated with Mirena IUD which was replaced 03/28/2014. Had two endometrial biopsies after her D&C in 2010 which showed proliferative endometrium.   Endometrial biopsy 04/2020 was normal.   She reports spotting has resolved.   PMHx: She  has a past medical history of Allergic rhinitis, Complex endometrial hyperplasia without atypia, and Hypertension. Also,  has a past surgical history that includes Hysteroscopy with D & C (2010); Endometrial biopsy; and Breast biopsy (Left, 02/04/2014 x 2)., family history includes Bone cancer in her father; Healthy in her mother; Thyroid disease in her mother.,  reports that she has never smoked. She has never used smokeless tobacco. She reports current alcohol use. She reports that she does not use drugs. No outpatient medications have been marked as taking for the 06/16/20 encounter (Office Visit) with Homero Fellers, MD.  .  Also, has No Known Allergies..  Review of Systems  Constitutional: Negative for chills, fever, malaise/fatigue and weight loss.  HENT: Negative for congestion, hearing loss and sinus pain.   Eyes: Negative for blurred vision and double vision.  Respiratory: Negative for cough, sputum production, shortness of breath and wheezing.   Cardiovascular: Negative for chest pain, palpitations, orthopnea and leg swelling.  Gastrointestinal: Negative for abdominal pain, constipation, diarrhea, nausea and vomiting.  Genitourinary: Negative for dysuria, flank pain, frequency, hematuria and urgency.  Musculoskeletal: Negative for back pain, falls and joint pain.  Skin: Negative for itching and rash.  Neurological: Negative for dizziness and headaches.  Psychiatric/Behavioral: Negative for  depression, substance abuse and suicidal ideas. The patient is not nervous/anxious.     Physical Exam:  There were no vitals taken for this visit. There is no height or weight on file to calculate BMI. Constitutional: Well nourished, well developed female in no acute distress.  Abdomen: diffusely non tender to palpation, non distended, and no masses, hernias Neuro: Grossly intact Psych:  Normal mood and affect.    Pelvic exam:  Two IUD strings present seen coming from the cervical os. EGBUS, vaginal vault and cervix: within normal limits  Assessment: IUD strings present in proper location; pt doing well  Plan: She was told to continue to use barrier contraception, in order to prevent any STIs, and to take a home pregnancy test or call us if she ever thinks she may be pregnant, and that her IUD expires in 5 years.  She was amenable to this plan and we will see her back in 1 year/PRN.  Plan an annual [place] to monitor endometrial thickness as her plan for surveillance long term. Continue IUD indefinitely exchanging every 5 years.   A total of 20 minutes were spent face-to-face with the patient as well as preparation, review, communication, and documentation during this encounter.   Adrian Prows MD, [facility], [facility] 06/16/2020 2:03 PM

## 2020-11-09 ENCOUNTER — Other Ambulatory Visit: Payer: Self-pay

## 2020-11-09 ENCOUNTER — Ambulatory Visit
Admission: RE | Admit: 2020-11-09 | Discharge: 2020-11-09 | Disposition: A | Discharge status: Home / Self Care | Payer: BC Managed Care – PPO | Source: Ambulatory Visit | Attending: Emergency Medicine | Admitting: Emergency Medicine

## 2020-11-09 VITALS — BP 154/96 | HR 74 | Temp 99.3°F | Resp 18

## 2020-11-09 DIAGNOSIS — L0390 Cellulitis, unspecified: Secondary | ICD-10-CM | POA: Diagnosis not present

## 2020-11-09 DIAGNOSIS — S0993XA Unspecified injury of face, initial encounter: Secondary | ICD-10-CM

## 2020-11-09 DIAGNOSIS — I10 Essential (primary) hypertension: Secondary | ICD-10-CM

## 2020-11-09 MED ORDER — DOXYCYCLINE HYCLATE 100 MG PO CAPS: 100.0000 mg | ORAL_CAPSULE | Freq: Two times a day (BID) | ORAL | 0 refills | Status: AC

## 2020-11-09 NOTE — ED Triage Notes (Signed)
Patient presents due to facial injury that occurred yesterday.   Patient denies LOC. Patient denies pain.   Patient states " I ran into a chain link fence" when onset of symptoms began.   Patient endorses drainage from lip.   Patient took ibuprofen w/ no relief of symptoms.

## 2020-11-09 NOTE — Discharge Instructions (Addendum)
Take the antibiotic as directed.  Keep the wounds clean and dry.  Wash them twice a day with soap and water, then apply an antibiotic ointment.    Follow-up with your primary care provider or return here if you see signs of worsening infection such as increased redness, pus-like drainage, fever, or other concerns.  Your blood pressure is elevated today at 173/108; repeat 158/90.  Please have this rechecked by your primary care provider in 1-2 weeks.

## 2020-11-09 NOTE — ED Provider Notes (Signed)
Monica Nelson    CSN: [id number] Arrival date & time: 11/09/20  1749      History   Chief Complaint Chief Complaint  Patient presents with   Facial Injury   APPT 1845     HPI Monica Nelson is a 50 y.o. female.  Patient presents with multiple wounds on her face since yesterday.  She states she was walking a dog when he pulled on the leash and pulled her straight into a chain link fence.  She denies loss of consciousness.  She is concerned for infection because the wounds are weeping.  She denies fever, chills, numbness, weakness, or other symptoms.  Last tetanus 2020.  Her medical history includes hypertension.  The history is provided by the patient and medical records.   Past Medical History:  Diagnosis Date   Allergic rhinitis    Complex endometrial hyperplasia without atypia    currently treat with Mirena   Hypertension     Patient Active Problem List   Diagnosis Date Noted   Hypercholesterolemia 02/11/2019   Cardiac murmur 06/08/2015   Allergic rhinitis 01/05/2015   Bradycardia, drug induced 01/05/2015   Chronic sinusitis 01/05/2015   Dermatologic disease 09/07/2009   Obesity 01/29/2009   Blood pressure elevated without history of HTN 08/14/2008   Excess, menstruation 07/31/2008   Anemia, iron deficiency 05/31/2007    Past Surgical History:  Procedure Laterality Date   BREAST BIOPSY Left 02/04/2014 x 2   negative   ENDOMETRIAL BIOPSY     May 2010 complex hyperplasia, Nov 2011 prolif endometrium, 2014 prolif   HYSTEROSCOPY WITH D & C  2010    OB History     Gravida  0   Para  0   Term  0   Preterm  0   AB  0   Living  0      SAB  0   IAB  0   Ectopic  0   Multiple  0   Live Births  0            Home Medications    Prior to Admission medications   Medication Sig Start Date End Date Taking? Authorizing Provider  Ascorbic Acid (VITAMIN C) 1000 MG tablet Take 1,000 mg by mouth daily.    Yes [provider]   Calcium Carbonate-Vitamin D 600-200 MG-UNIT TABS Take 1 tablet by mouth daily.   Yes [provider]  cetirizine (ZYRTEC) 10 MG tablet Take 10 mg by mouth daily.   Yes [provider]  doxycycline (VIBRAMYCIN) 100 MG capsule Take 1 capsule (100 mg total) by mouth 2 (two) times daily for 7 days. 11/09/20 11/16/20 Yes Sharion Balloon, NP  ibuprofen (ADVIL) 800 MG tablet TAKE 1 TABLET (800 MG TOTAL) BY MOUTH 2 (TWO) TIMES DAILY. 05/27/20  Yes Burnette, Clearnce Sorrel, PA-C  Misc Natural Products (OSTEO BI-FLEX ADV JOINT SHIELD) TABS Take 2 tablets by mouth daily.    Yes [provider]  Red Yeast Rice 600 MG CAPS Take 1-2 capsules by mouth daily.   Yes [provider]  fluticasone (FLONASE) 50 MCG/ACT nasal spray SPRAY 2 SPRAYS INTO EACH NOSTRIL EVERY DAY 09/24/18   Mar Daring, PA-C  levonorgestrel (MIRENA) 20 MCG/24HR IUD MIRENA, 20MCG/24HR (Intrauterine Intrauterine Device)  for 0 days  Quantity: 0.00;  Refills: 0   Ordered :19-Nov-2009  Ashley Royalty ;  Started 29-Jun-2009 Active Comments: DX: 626.2 06/29/09   [provider]    Family History  Family History  Problem Relation Age of Onset   Healthy Mother    Thyroid disease Mother    Bone cancer Father    Breast cancer Neg Hx     Social History Social History   Tobacco Use   Smoking status: Never   Smokeless tobacco: Never  Vaping Use   Vaping Use: Never used  Substance Use Topics   Alcohol use: Yes    Comment: occasional   Drug use: No     Allergies   Patient has no known allergies.   Review of Systems Review of Systems  Constitutional:  Negative for chills and fever.  Respiratory:  Negative for cough and shortness of breath.   Cardiovascular:  Negative for chest pain and palpitations.  Gastrointestinal:  Negative for abdominal pain and vomiting.  Skin:  Positive for color change and wound.  All other systems reviewed and are negative.   Physical Exam Triage Vital  Signs ED Triage Vitals  Enc Vitals Group     BP      Pulse      Resp      Temp      Temp src      SpO2      Weight      Height      Head Circumference      Peak Flow      Pain Score      Pain Loc      Pain Edu?      Excl. in [place]?    No data found.  Updated Vital Signs BP (!) 158/90 (BP Location: Right Arm)   Pulse 74   Temp 99.3 F (37.4 C) (Oral)   Resp 18   LMP  (LMP Unknown)   SpO2 97%   Visual Acuity Right Eye Distance:   Left Eye Distance:   Bilateral Distance:    Right Eye Near:   Left Eye Near:    Bilateral Near:     Physical Exam Vitals and nursing note reviewed.  Constitutional:      General: She is not in acute distress.    Appearance: She is well-developed.  HENT:     Head: Normocephalic and atraumatic.     Mouth/Throat:     Mouth: Mucous membranes are moist.  Eyes:     Conjunctiva/sclera: Conjunctivae normal.  Cardiovascular:     Rate and Rhythm: Normal rate and regular rhythm.     Heart sounds: Normal heart sounds.  Pulmonary:     Effort: Pulmonary effort is normal. No respiratory distress.     Breath sounds: Normal breath sounds.  Abdominal:     Palpations: Abdomen is soft.     Tenderness: There is no abdominal tenderness.  Musculoskeletal:     Cervical back: Neck supple.  Skin:    General: Skin is warm and dry.     Findings: Lesion present.     Comments: Multiple wounds on face with scant clear yellow drainage and yellow crusting.  Mild erythema and edema.  See picture.   Neurological:     General: No focal deficit present.     Mental Status: She is alert and oriented to person, place, and time.     Sensory: No sensory deficit.     Motor: No weakness.     Gait: Gait normal.  Psychiatric:        Mood and Affect: Mood normal.        Behavior: Behavior normal.      UC  Treatments / Results  Labs (all labs ordered are listed, but only abnormal results are displayed) Labs Reviewed - No data to display  EKG   Radiology No  results found.  Procedures Procedures (including critical care time)  Medications Ordered in UC Medications - No data to display  Initial Impression / Assessment and Plan / UC Course  I have reviewed the triage vital signs and the nursing notes.  Pertinent labs & imaging results that were available during my care of the patient were reviewed by me and considered in my medical decision making (see chart for details).  Facial injury with cellulitis.  Elevated blood pressure reading with known hypertension.  Treating with doxycycline.  Wound care instructions and signs of worsening infection discussed with patient.  She is up-to-date on her tetanus.  Instructed her to return here or follow-up with her PCP right away if she notes signs of worsening infection. Also discussed that her blood pressure is elevated today and needs to be rechecked by her PCP in 1 to 2 weeks.  Education provided on managing hypertension.  Patient agrees to plan of care.   Final Clinical Impressions(s) / UC Diagnoses   Final diagnoses:  Facial injury, initial encounter  Cellulitis, unspecified cellulitis site  Elevated blood pressure reading in office with diagnosis of hypertension     Discharge Instructions      Take the antibiotic as directed.  Keep the wounds clean and dry.  Wash them twice a day with soap and water, then apply an antibiotic ointment.    Follow-up with your primary care provider or return here if you see signs of worsening infection such as increased redness, pus-like drainage, fever, or other concerns.  Your blood pressure is elevated today at 173/108; repeat 158/90.  Please have this rechecked by your primary care provider in 1-2 weeks.          ED Prescriptions     Medication Sig Dispense Auth. Provider   doxycycline (VIBRAMYCIN) 100 MG capsule Take 1 capsule (100 mg total) by mouth 2 (two) times daily for 7 days. 14 capsule Sharion Balloon, NP      PDMP not reviewed this  encounter.   Sharion Balloon, NP 11/09/20 [phone]

## 2020-11-10 ENCOUNTER — Ambulatory Visit: Payer: BC Managed Care – PPO

## 2020-11-10 ENCOUNTER — Ambulatory Visit: Payer: BC Managed Care – PPO | Admitting: Family Medicine

## 2020-11-24 ENCOUNTER — Telehealth: Payer: Self-pay

## 2020-11-24 NOTE — Telephone Encounter (Signed)
Copied from [place] [id number]. Topic: Appointment Scheduling - Scheduling Inquiry for Clinic >> Nov 24, 2020 10:20 AM Monica Nelson wrote: Reason for CRM: Pt wants to sch an appt if possible with Daneil Dan as she went to UC last week and her BP was 119/103. She does not take her BP so has no idea what it is now and thinks it may have been just b/c she was at [facility] but she was tod to FU with her PCP. She alsocanceled a CPE that was sch with Dr B and was marked for a resch. Please FU at [phone]

## 2020-11-24 NOTE — Telephone Encounter (Signed)
Called and spoke with patient and arranged appt for this Friday 11/27/20. KW

## 2020-11-27 ENCOUNTER — Ambulatory Visit: Payer: BC Managed Care – PPO | Admitting: Family Medicine

## 2020-11-27 ENCOUNTER — Other Ambulatory Visit: Payer: Self-pay | Admitting: Family Medicine

## 2020-11-27 ENCOUNTER — Encounter: Payer: Self-pay | Admitting: Family Medicine

## 2020-11-27 ENCOUNTER — Other Ambulatory Visit: Payer: Self-pay

## 2020-11-27 VITALS — BP 161/89 | HR 70 | Temp 98.6°F | Resp 16 | Wt 194.6 lb

## 2020-11-27 DIAGNOSIS — K5901 Slow transit constipation: Secondary | ICD-10-CM | POA: Diagnosis not present

## 2020-11-27 DIAGNOSIS — I10 Essential (primary) hypertension: Secondary | ICD-10-CM

## 2020-11-27 DIAGNOSIS — R5383 Other fatigue: Secondary | ICD-10-CM | POA: Insufficient documentation

## 2020-11-27 DIAGNOSIS — E6609 Other obesity due to excess calories: Secondary | ICD-10-CM | POA: Diagnosis not present

## 2020-11-27 DIAGNOSIS — R898 Other abnormal findings in specimens from other organs, systems and tissues: Secondary | ICD-10-CM | POA: Diagnosis not present

## 2020-11-27 DIAGNOSIS — Z6833 Body mass index (BMI) 33.0-33.9, adult: Secondary | ICD-10-CM

## 2020-11-27 DIAGNOSIS — R011 Cardiac murmur, unspecified: Secondary | ICD-10-CM | POA: Diagnosis not present

## 2020-11-27 MED ORDER — LINACLOTIDE 145 MCG PO CAPS: 145.0000 ug | ORAL_CAPSULE | Freq: Every day | ORAL | 0 refills | Status: DC

## 2020-11-27 MED ORDER — LISINOPRIL 10 MG PO TABS: 10.0000 mg | ORAL_TABLET | Freq: Every day | ORAL | 0 refills | Status: DC

## 2020-11-27 NOTE — Assessment & Plan Note (Signed)
Recent complaint of constipation s/p Abx use for face laceration Recommend increased water, activity, fiber Can use osmolitic laxatives or stool softeners; also recommend suppository if needed Restrain from stimulants drinks with caffeine

## 2020-11-27 NOTE — Assessment & Plan Note (Signed)
Pt wishes to lose weight Wishes to discuss weight loss medication assistance OK with waiting while we get BP under control

## 2020-11-27 NOTE — Assessment & Plan Note (Signed)
Not always heard per pt report and per chart review Referral placed to cards Patient denies symptoms outside of htn and lower extremity edema

## 2020-11-27 NOTE — Assessment & Plan Note (Signed)
Weight gain per pt report However, down since last inpatient visit Wishes to start weight loss medication- advised waiting until BP under control and lab results return

## 2020-11-27 NOTE — Assessment & Plan Note (Signed)
Noted in UC setting following fall at work Has been off medications for ~6 years Restart ACEi today; plan for f/u in 2-4 wks

## 2020-11-27 NOTE — Progress Notes (Signed)
Established patient visit   Patient: Monica Nelson   DOB: 05-05-1970   50 y.o. Female  MRN: [id number] Visit Date: 11/27/2020  Today's healthcare provider: Gwyneth Sprout, FNP   Chief Complaint  Patient presents with   Hypertension   Subjective    HPI HPI     Hypertension   This is a recurrent problem.  Recent episode started more than a year ago.  The problem is uncontrolled.  Anxiety: Absent.  Blurred vision: Absent.  Chest pain: Absent.  Chest pressure/discomfort: Absent.  Dyspnea: Absent.  Headaches: Absent.  Lower extremity edema: Present.  Orthopnea: Absent.  Palpitations: Absent.  Paroxysmal nocturnal dyspnea: Absent.  Syncope: Absent.  The patient never exercises.      Last edited by Minette Headland, CMA on 11/27/2020  1:07 PM.         Medications: Outpatient Medications Prior to Visit  Medication Sig   Ascorbic Acid (VITAMIN C) 1000 MG tablet Take 1,000 mg by mouth daily.    Calcium Carbonate-Vitamin D 600-200 MG-UNIT TABS Take 1 tablet by mouth daily.   cetirizine (ZYRTEC) 10 MG tablet Take 10 mg by mouth daily.   fluticasone (FLONASE) 50 MCG/ACT nasal spray SPRAY 2 SPRAYS INTO EACH NOSTRIL EVERY DAY   ibuprofen (ADVIL) 800 MG tablet TAKE 1 TABLET (800 MG TOTAL) BY MOUTH 2 (TWO) TIMES DAILY.   levonorgestrel (MIRENA) 20 MCG/24HR IUD MIRENA, 20MCG/24HR (Intrauterine Intrauterine Device)  for 0 days  Quantity: 0.00;  Refills: 0   Ordered :19-Nov-2009  Ashley Royalty ;  Started 29-Jun-2009 Active Comments: DX: 626.2   Misc Natural Products (OSTEO BI-FLEX ADV JOINT SHIELD) TABS Take 2 tablets by mouth daily.    Red Yeast Rice 600 MG CAPS Take 1-2 capsules by mouth daily.   No facility-administered medications prior to visit.    Review of Systems     Objective    BP (!) 161/89   Pulse 70   Temp 98.6 F (37 C) (Oral)   Resp 16   Wt 194 lb 9.6 oz (88.3 kg)   LMP  (LMP Unknown)   SpO2 99%   BMI 33.40 kg/m  {Show previous vital signs  (optional):23777}  Physical Exam Vitals and nursing note reviewed.  Constitutional:      General: She is not in acute distress.    Appearance: Normal appearance. She is obese. She is not ill-appearing, toxic-appearing or diaphoretic.  HENT:     Head: Normocephalic and atraumatic.  Cardiovascular:     Rate and Rhythm: Normal rate and regular rhythm.     Pulses: Normal pulses.     Heart sounds: Murmur heard.    No friction rub. No gallop.     Comments: 3/6 sternal border Pulmonary:     Effort: Pulmonary effort is normal. No respiratory distress.     Breath sounds: Normal breath sounds. No stridor. No wheezing, rhonchi or rales.  Chest:     Chest wall: No tenderness.  Abdominal:     General: Bowel sounds are normal.     Palpations: Abdomen is soft.  Musculoskeletal:        General: No swelling, tenderness, deformity or signs of injury. Normal range of motion.     Right lower leg: 1+ Edema present.     Left lower leg: 1+ Edema present.  Skin:    General: Skin is warm and dry.     Capillary Refill: Capillary refill takes less than 2 seconds.  Coloration: Skin is not jaundiced or pale.     Findings: No bruising, erythema, lesion or rash.  Neurological:     General: No focal deficit present.     Mental Status: She is alert and oriented to person, place, and time. Mental status is at baseline.     Cranial Nerves: No cranial nerve deficit.     Sensory: No sensory deficit.     Motor: No weakness.     Coordination: Coordination normal.  Psychiatric:        Mood and Affect: Mood normal.        Behavior: Behavior normal.        Thought Content: Thought content normal.        Judgment: Judgment normal.    No results found for any visits on 11/27/20.  Assessment & Plan     Problem List Items Addressed This Visit       Cardiovascular and Mediastinum   Primary hypertension - Primary    Noted in UC setting following fall at work Has been off medications for ~6 years 60  ACEi today; plan for f/u in 2-4 wks      Relevant Medications   lisinopril (ZESTRIL) 10 MG tablet   Other Relevant Orders   Ambulatory referral to Cardiology     Digestive   Slow transit constipation    Recent complaint of constipation s/p Abx use for face laceration Recommend increased water, activity, fiber Can use osmolitic laxatives or stool softeners; also recommend suppository if needed Restrain from stimulants drinks with caffeine        Other   Fatigue    Weight gain per pt report However, down since last inpatient visit Wishes to start weight loss medication- advised waiting until BP under control and lab results return      Relevant Orders   CBC with Differential/Platelet   Comprehensive metabolic panel   Hemoglobin A1c   VITAMIN D 25 Hydroxy (Vit-D Deficiency, Fractures)   Vitamin B12   TSH   T4, free   Magnesium   Lipid panel   Class 1 obesity due to excess calories with serious comorbidity and body mass index (BMI) of 33.0 to 33.9 in adult    Pt wishes to lose weight Wishes to discuss weight loss medication assistance OK with waiting while we get BP under control      Heart murmur on physical examination    Not always heard per pt report and per chart review Referral placed to cards Patient denies symptoms outside of htn and lower extremity edema      Relevant Orders   Ambulatory referral to Cardiology     Return in about 4 weeks (around 12/25/2020).      Vonna Kotyk, FNP, have reviewed all documentation for this visit. The documentation on 11/27/20 for the exam, diagnosis, procedures, and orders are all accurate and complete.    Gwyneth Sprout, [place] [phone] (phone) [phone] (fax)  [place]

## 2020-11-28 LAB — CBC WITH DIFFERENTIAL/PLATELET
Basophils Absolute: 0.1 10*3/uL (ref 0.0–0.2)
Basos: 1 %
EOS (ABSOLUTE): 0.4 10*3/uL (ref 0.0–0.4)
Eos: 5 %
Hematocrit: 45.1 % (ref 34.0–46.6)
Hemoglobin: 14.8 g/dL (ref 11.1–15.9)
Immature Grans (Abs): 0 10*3/uL (ref 0.0–0.1)
Immature Granulocytes: 0 %
Lymphocytes Absolute: 1.8 10*3/uL (ref 0.7–3.1)
Lymphs: 24 %
MCH: 29.1 pg (ref 26.6–33.0)
MCHC: 32.8 g/dL (ref 31.5–35.7)
MCV: 89 fL (ref 79–97)
Monocytes Absolute: 0.5 10*3/uL (ref 0.1–0.9)
Monocytes: 7 %
Neutrophils Absolute: 4.7 10*3/uL (ref 1.4–7.0)
Neutrophils: 63 %
Platelets: 310 10*3/uL (ref 150–450)
RBC: 5.08 x10E6/uL (ref 3.77–5.28)
RDW: 13 % (ref 11.7–15.4)
WBC: 7.5 10*3/uL (ref 3.4–10.8)

## 2020-11-28 LAB — HEMOGLOBIN A1C
Est. average glucose Bld gHb Est-mCnc: 120 mg/dL
Hgb A1c MFr Bld: 5.8 % — ABNORMAL HIGH (ref 4.8–5.6)

## 2020-11-28 LAB — LIPID PANEL
Chol/HDL Ratio: 3.4 ratio (ref 0.0–4.4)
Cholesterol, Total: 195 mg/dL (ref 100–199)
HDL: 57 mg/dL (ref >39)
LDL Chol Calc (NIH): 127 mg/dL — ABNORMAL HIGH (ref 0–99)
Triglycerides: 60 mg/dL (ref 0–149)
VLDL Cholesterol Cal: 11 mg/dL (ref 5–40)

## 2020-11-28 LAB — COMPREHENSIVE METABOLIC PANEL
ALT: 15 IU/L (ref 0–32)
AST: 17 IU/L (ref 0–40)
Albumin/Globulin Ratio: 1.7 (ref 1.2–2.2)
Albumin: 4.5 g/dL (ref 3.8–4.8)
Alkaline Phosphatase: 77 IU/L (ref 44–121)
BUN/Creatinine Ratio: 26 — ABNORMAL HIGH (ref 9–23)
BUN: 20 mg/dL (ref 6–24)
Bilirubin Total: 0.3 mg/dL (ref 0.0–1.2)
CO2: 23 mmol/L (ref 20–29)
Calcium: 9.7 mg/dL (ref 8.7–10.2)
Chloride: 102 mmol/L (ref 96–106)
Creatinine, Ser: 0.76 mg/dL (ref 0.57–1.00)
Globulin, Total: 2.6 g/dL (ref 1.5–4.5)
Glucose: 89 mg/dL (ref 65–99)
Potassium: 4.8 mmol/L (ref 3.5–5.2)
Sodium: 139 mmol/L (ref 134–144)
Total Protein: 7.1 g/dL (ref 6.0–8.5)
eGFR: 96 mL/min/{1.73_m2} (ref >59)

## 2020-11-28 LAB — VITAMIN B12: Vitamin B-12: 312 pg/mL (ref 232–1245)

## 2020-11-28 LAB — TSH: TSH: 1.8 u[IU]/mL (ref 0.450–4.500)

## 2020-11-28 LAB — MAGNESIUM: Magnesium: 2.1 mg/dL (ref 1.6–2.3)

## 2020-11-28 LAB — VITAMIN D 25 HYDROXY (VIT D DEFICIENCY, FRACTURES): Vit D, 25-Hydroxy: 37.8 ng/mL (ref 30.0–100.0)

## 2020-11-28 LAB — T4, FREE: Free T4: 1.11 ng/dL (ref 0.82–1.77)

## 2020-11-30 ENCOUNTER — Encounter: Payer: Self-pay | Admitting: Family Medicine

## 2020-12-04 ENCOUNTER — Encounter: Payer: Self-pay | Admitting: Family Medicine

## 2020-12-16 ENCOUNTER — Encounter: Payer: Self-pay | Admitting: Family Medicine

## 2020-12-16 ENCOUNTER — Ambulatory Visit: Payer: Self-pay | Admitting: *Deleted

## 2020-12-16 NOTE — Telephone Encounter (Signed)
Reason for Disposition  [4] Systolic BP  >= 695 OR Diastolic >= 80 AND [0] taking BP medications  Answer Assessment - Initial Assessment Questions 1. BLOOD PRESSURE: "What is the blood pressure?" "Did you take at least two measurements 5 minutes apart?"     Pt called in.   She went to donate blood and they turned her away because her BP was high.   My BP 106/101.  After I sat there a few minutes it came down.  2. ONSET: "When did you take your blood pressure?"     At the blood donation place for giving blood.   11:00 this morning  I did take my lisinopril this morning. 3. HOW: "How did you obtain the blood pressure?" (e.g., visiting nurse, automatic home BP monitor)     At the blood donation donation bus.   4. HISTORY: "Do you have a history of high blood pressure?"     Yes 5. MEDICATIONS: "Are you taking any medications for blood pressure?" "Have you missed any doses recently?"     Lisinopril   6. OTHER SYMPTOMS: "Do you have any symptoms?" (e.g., headache, chest pain, blurred vision, difficulty breathing, weakness)     I'm wondering if I was my first time donating blood.   I was nervous.  I just started the lisinopril a month ago.   7. PREGNANCY: "Is there any chance you are pregnant?" "When was your last menstrual period?"     Not asked  Protocols used: Blood Pressure - High-A-AH

## 2020-12-16 NOTE — Telephone Encounter (Signed)
Pt called in because she went to donate blood today and they turned her away because her BP was 106/101.   She denies any symptoms.   She did start lisinopril a month ago.   She does not have a BP machine so is not monitoring her BP.   She was nervous because it was her first time giving.  I called into [facility] and spoke with [place].  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   I agreed with her that was an odd BP reading of 106/101.  Pt was agreeable to going and getting her BP checked.   She has put a message into MyCHart to Tally Joe, NP.   She will send a message to Tally Joe after she goes and gets it checked.   I told her to get 2 readings 5-10 minutes apart.   She verbalized understanding.

## 2020-12-31 ENCOUNTER — Other Ambulatory Visit: Payer: Self-pay

## 2020-12-31 ENCOUNTER — Ambulatory Visit: Payer: BC Managed Care – PPO | Admitting: Family Medicine

## 2020-12-31 ENCOUNTER — Encounter: Payer: Self-pay | Admitting: Family Medicine

## 2020-12-31 VITALS — BP 143/88 | HR 66 | Temp 98.6°F | Wt 193.2 lb

## 2020-12-31 DIAGNOSIS — Z23 Encounter for immunization: Secondary | ICD-10-CM | POA: Diagnosis not present

## 2020-12-31 DIAGNOSIS — E669 Obesity, unspecified: Secondary | ICD-10-CM

## 2020-12-31 DIAGNOSIS — I10 Essential (primary) hypertension: Secondary | ICD-10-CM | POA: Diagnosis not present

## 2020-12-31 MED ORDER — LISINOPRIL 20 MG PO TABS: 20.0000 mg | ORAL_TABLET | Freq: Every day | ORAL | 3 refills | Status: DC

## 2020-12-31 NOTE — Assessment & Plan Note (Addendum)
BMI 33.16 Discussed importance of healthy weight management Discussed diet and exercise

## 2020-12-31 NOTE — Progress Notes (Signed)
Established patient visit   Patient: Monica Nelson   DOB: June 23, 1970   50 y.o. Female  MRN: [id number] Visit Date: 12/31/2020  Today's healthcare provider: Gwyneth Sprout, FNP   Chief Complaint  Patient presents with   Hypertension   Subjective    HPI  Hypertension, follow-up  BP Readings from Last 3 Encounters:  12/31/20 (!) 143/88  11/27/20 (!) 161/89  11/09/20 (!) 154/96   Wt Readings from Last 3 Encounters:  12/31/20 193 lb 3.2 oz (87.6 kg)  11/27/20 194 lb 9.6 oz (88.3 kg)  06/16/20 209 lb 6.4 oz (95 kg)     She was last seen for hypertension 1 months ago.  BP at that visit was 161/89. Management since that visit includes restarted patient on Lisinopril 33m .  She reports excellent compliance with treatment. She is not having side effects.  She is following a Regular diet. She is exercising. She does not smoke.  Use of agents associated with hypertension: none.   Outside blood pressures are not being checked. Symptoms: No chest pain No chest pressure  No palpitations No syncope  No dyspnea No orthopnea  No paroxysmal nocturnal dyspnea No lower extremity edema   Pertinent labs: Lab Results  Component Value Date   CHOL 195 11/27/2020   HDL 57 11/27/2020   LDLCALC 127 (H) 11/27/2020   TRIG 60 11/27/2020   CHOLHDL 3.4 11/27/2020   Lab Results  Component Value Date   NA 139 11/27/2020   K 4.8 11/27/2020   CREATININE 0.76 11/27/2020   EGFR 96 11/27/2020   GFRNONAA 103 02/17/2020   GLUCOSE 89 11/27/2020     The 10-year ASCVD risk score (Arnett DK, et al., 2019) is: 1.8%   ---------------------------------------------------------------------------------------------------   Medications: Outpatient Medications Prior to Visit  Medication Sig   [DISCONTINUED] lisinopril (ZESTRIL) 10 MG tablet Take 20 mg by mouth daily.   Ascorbic Acid (VITAMIN C) 1000 MG tablet Take 1,000 mg by mouth daily.    Calcium Carbonate-Vitamin D 600-200 MG-UNIT  TABS Take 1 tablet by mouth daily.   cetirizine (ZYRTEC) 10 MG tablet Take 10 mg by mouth daily.   fluticasone (FLONASE) 50 MCG/ACT nasal spray SPRAY 2 SPRAYS INTO EACH NOSTRIL EVERY DAY   ibuprofen (ADVIL) 800 MG tablet TAKE 1 TABLET (800 MG TOTAL) BY MOUTH 2 (TWO) TIMES DAILY.   levonorgestrel (MIRENA) 20 MCG/24HR IUD MIRENA, 20MCG/24HR (Intrauterine Intrauterine Device)  for 0 days  Quantity: 0.00;  Refills: 0   Ordered :19-Nov-2009  WAshley Royalty;  Started 29-Jun-2009 Active Comments: DX: 626.2   Misc Natural Products (OSTEO BI-FLEX ADV JOINT SHIELD) TABS Take 2 tablets by mouth daily.    Red Yeast Rice 600 MG CAPS Take 1-2 capsules by mouth daily.   [DISCONTINUED] LINZESS 145 MCG CAPS capsule TAKE 1 CAPSULE BY MOUTH EVERY DAY BEFORE BREAKFAST   No facility-administered medications prior to visit.    Review of Systems     Objective    BP (!) 143/88   Pulse 66   Temp 98.6 F (37 C) (Oral)   Wt 193 lb 3.2 oz (87.6 kg)   BMI 33.16 kg/m    Physical Exam Vitals and nursing note reviewed.  Constitutional:      General: She is not in acute distress.    Appearance: Normal appearance. She is obese. She is not ill-appearing, toxic-appearing or diaphoretic.  HENT:     Head: Normocephalic and atraumatic.  Cardiovascular:     Rate  and Rhythm: Normal rate and regular rhythm.     Pulses: Normal pulses.     Heart sounds: Normal heart sounds. No murmur heard.   No friction rub. No gallop.  Pulmonary:     Effort: Pulmonary effort is normal. No respiratory distress.     Breath sounds: Normal breath sounds. No stridor. No wheezing, rhonchi or rales.  Chest:     Chest wall: No tenderness.  Abdominal:     General: Bowel sounds are normal.     Palpations: Abdomen is soft.  Musculoskeletal:        General: No swelling, tenderness, deformity or signs of injury. Normal range of motion.     Right lower leg: No edema.     Left lower leg: No edema.  Skin:    General: Skin is warm and  dry.     Capillary Refill: Capillary refill takes less than 2 seconds.     Coloration: Skin is not jaundiced or pale.     Findings: No bruising, erythema, lesion or rash.  Neurological:     General: No focal deficit present.     Mental Status: She is alert and oriented to person, place, and time. Mental status is at baseline.     Cranial Nerves: No cranial nerve deficit.     Sensory: No sensory deficit.     Motor: No weakness.     Coordination: Coordination normal.  Psychiatric:        Mood and Affect: Mood normal.        Behavior: Behavior normal.        Thought Content: Thought content normal.        Judgment: Judgment normal.    No results found for any visits on 12/31/20.  Assessment & Plan     Problem List Items Addressed This Visit       Cardiovascular and Mediastinum   Primary hypertension - Primary    BP improving; increase to 7m; continue to use 161m double up New Rx written Denies CP, SOB, DOE Cards referral placed- has been acknowledged No LE edema      Relevant Medications   lisinopril (ZESTRIL) 20 MG tablet     Other   Obesity (BMI 30.0-34.9)    BMI 33.16 Discussed importance of healthy weight management Discussed diet and exercise         Return in about 6 months (around 07/01/2021) for HTN management, annual examination.      I,Vonna KotykFNP, have reviewed all documentation for this visit. The documentation on 12/31/20 for the exam, diagnosis, procedures, and orders are all accurate and complete.    [place]wyneth [phone]) [phone])  [place]

## 2020-12-31 NOTE — Assessment & Plan Note (Signed)
BP improving; increase to 20mg ; continue to use 10mg - double up New Rx written Denies CP, SOB, DOE Cards referral placed- has been acknowledged No LE edema

## 2021-01-06 ENCOUNTER — Other Ambulatory Visit: Payer: Self-pay | Admitting: Family Medicine

## 2021-01-06 DIAGNOSIS — Z1231 Encounter for screening mammogram for malignant neoplasm of breast: Secondary | ICD-10-CM

## 2021-01-14 ENCOUNTER — Ambulatory Visit: Payer: BC Managed Care – PPO | Admitting: Cardiology

## 2021-01-14 DIAGNOSIS — R001 Bradycardia, unspecified: Secondary | ICD-10-CM | POA: Diagnosis not present

## 2021-01-14 DIAGNOSIS — I340 Nonrheumatic mitral (valve) insufficiency: Secondary | ICD-10-CM | POA: Diagnosis not present

## 2021-01-14 DIAGNOSIS — R011 Cardiac murmur, unspecified: Secondary | ICD-10-CM | POA: Diagnosis not present

## 2021-01-14 DIAGNOSIS — Z23 Encounter for immunization: Secondary | ICD-10-CM | POA: Diagnosis not present

## 2021-01-25 ENCOUNTER — Encounter: Payer: Self-pay | Admitting: Family Medicine

## 2021-02-05 ENCOUNTER — Telehealth: Payer: BC Managed Care – PPO | Admitting: Family Medicine

## 2021-02-05 ENCOUNTER — Encounter: Payer: Self-pay | Admitting: Family Medicine

## 2021-02-05 VITALS — Temp 97.2°F | Wt 193.0 lb

## 2021-02-05 DIAGNOSIS — H938X3 Other specified disorders of ear, bilateral: Secondary | ICD-10-CM | POA: Diagnosis not present

## 2021-02-05 DIAGNOSIS — J0100 Acute maxillary sinusitis, unspecified: Secondary | ICD-10-CM | POA: Insufficient documentation

## 2021-02-05 DIAGNOSIS — R051 Acute cough: Secondary | ICD-10-CM | POA: Diagnosis not present

## 2021-02-05 MED ORDER — PREDNISONE 20 MG PO TABS: 20.0000 mg | ORAL_TABLET | Freq: Every day | ORAL | 0 refills | Status: DC

## 2021-02-05 MED ORDER — GUAIFENESIN-DM 100-10 MG/5ML PO SYRP: 10.0000 mL | ORAL_SOLUTION | ORAL | 0 refills | Status: DC | PRN

## 2021-02-05 MED ORDER — AMOXICILLIN-POT CLAVULANATE 875-125 MG PO TABS: 1.0000 | ORAL_TABLET | Freq: Two times a day (BID) | ORAL | 0 refills | Status: DC

## 2021-02-05 NOTE — Assessment & Plan Note (Signed)
Productive Denies rattle Denies wheeze Able to speak in full sentences

## 2021-02-05 NOTE — Progress Notes (Signed)
MyChart Video Visit    Virtual Visit via Video Note   This visit type was conducted due to national recommendations for restrictions regarding the COVID-19 Pandemic (e.g. social distancing) in an effort to limit this patient's exposure and mitigate transmission in our community. This patient is at least at moderate risk for complications without adequate follow up. This format is felt to be most appropriate for this patient at this time. Physical exam was limited by quality of the video and audio technology used for the visit.   Patient location: home, phone call used as secondary for sound as video was just video without sound and was aborted Provider location: [facility]  I discussed the limitations of evaluation and management by telemedicine and the availability of in person appointments. The patient expressed understanding and agreed to proceed.  Patient: Monica Nelson   DOB: 05/13/1970   50 y.o. Female  MRN: [id number] Visit Date: 02/05/2021  Today's healthcare provider: Gwyneth Sprout, FNP   Chief Complaint  Patient presents with   Sinus Problem   Subjective    Sinus Problem This is a new problem. The current episode started 1 to 4 weeks ago. The problem is unchanged. There has been no fever. Associated symptoms include sinus pressure and swollen glands. Pertinent negatives include no chills, congestion, coughing, diaphoresis, ear pain, headaches, hoarse voice, neck pain, shortness of breath, sneezing or sore throat. Treatments tried: Mucinex D. The treatment provided no relief.  HPI   Patient states that she had taken a covid test today and states that results were negative.  Last edited by Minette Headland, CMA on 02/05/2021  8:49 AM.      >2 week hx of sinus pain around nose; despite taking daily zyrtec and using flonase Feels like 'fluid is slooshing in her ears' Feels like her head is 'in a barrel'  Denies difficult sleeping  Medications: Outpatient Medications  Prior to Visit  Medication Sig   Ascorbic Acid (VITAMIN C) 1000 MG tablet Take 1,000 mg by mouth daily.    Calcium Carbonate-Vitamin D 600-200 MG-UNIT TABS Take 1 tablet by mouth daily.   cetirizine (ZYRTEC) 10 MG tablet Take 10 mg by mouth daily.   fluticasone (FLONASE) 50 MCG/ACT nasal spray SPRAY 2 SPRAYS INTO EACH NOSTRIL EVERY DAY   ibuprofen (ADVIL) 800 MG tablet TAKE 1 TABLET (800 MG TOTAL) BY MOUTH 2 (TWO) TIMES DAILY.   levonorgestrel (MIRENA) 20 MCG/24HR IUD MIRENA, 20MCG/24HR (Intrauterine Intrauterine Device)  for 0 days  Quantity: 0.00;  Refills: 0   Ordered :19-Nov-2009  Ashley Royalty ;  Started 29-Jun-2009 Active Comments: DX: 626.2   lisinopril (ZESTRIL) 20 MG tablet Take 1 tablet (20 mg total) by mouth daily.   Misc Natural Products (OSTEO BI-FLEX ADV JOINT SHIELD) TABS Take 2 tablets by mouth daily.    Red Yeast Rice 600 MG CAPS Take 1-2 capsules by mouth daily.   No facility-administered medications prior to visit.    Review of Systems  Constitutional:  Negative for chills and diaphoresis.  HENT:  Positive for sinus pressure. Negative for congestion, ear pain, hoarse voice, sneezing and sore throat.   Respiratory:  Negative for cough and shortness of breath.   Musculoskeletal:  Negative for neck pain.  Neurological:  Negative for headaches.     Objective    Temp (!) 97.2 F (36.2 C) (Oral)   Wt 193 lb (87.5 kg)   BMI 33.13 kg/m    Physical Exam  Assessment & Plan     Problem List Items Addressed This Visit       Respiratory   Acute non-recurrent maxillary sinusitis - Primary    Reports > 2 weeks pressure around nose with complaints of nasal drainage and productive cough Yellow mucus/phlegm      Relevant Medications   predniSONE (DELTASONE) 20 MG tablet   amoxicillin-clavulanate (AUGMENTIN) 875-125 MG tablet   guaiFENesin-dextromethorphan (ROBITUSSIN DM) 100-10 MG/5ML syrup     Other   Ear pressure, bilateral    'fluid slooshing'  bilateral Prednisone burst      Relevant Medications   predniSONE (DELTASONE) 20 MG tablet   Acute cough    Productive Denies rattle Denies wheeze Able to speak in full sentences      Relevant Medications   guaiFENesin-dextromethorphan (ROBITUSSIN DM) 100-10 MG/5ML syrup     Return if symptoms worsen or fail to improve.     I discussed the assessment and treatment plan with the patient. The patient was provided an opportunity to ask questions and all were answered. The patient agreed with the plan and demonstrated an understanding of the instructions.   The patient was advised to call back or seek an in-person evaluation if the symptoms worsen or if the condition fails to improve as anticipated.  I provided 5 minutes of non-face-to-face time during this encounter.  Vonna Kotyk, FNP, have reviewed all documentation for this visit. The documentation on 02/05/21 for the exam, diagnosis, procedures, and orders are all accurate and complete.   Gwyneth Sprout, [place] [phone] (phone) [phone] (fax)  [place]

## 2021-02-05 NOTE — Assessment & Plan Note (Addendum)
Reports > 2 weeks pressure around nose with complaints of nasal drainage and productive cough Yellow mucus/phlegm

## 2021-02-05 NOTE — Assessment & Plan Note (Signed)
'  fluid slooshing' bilateral Prednisone burst

## 2021-02-08 ENCOUNTER — Other Ambulatory Visit: Payer: Self-pay | Admitting: Family Medicine

## 2021-02-10 ENCOUNTER — Encounter: Payer: Self-pay | Admitting: Family Medicine

## 2021-02-10 ENCOUNTER — Other Ambulatory Visit: Payer: Self-pay | Admitting: Family Medicine

## 2021-02-16 ENCOUNTER — Encounter: Payer: Self-pay | Admitting: Family Medicine

## 2021-02-19 ENCOUNTER — Encounter: Payer: Self-pay | Admitting: Physician Assistant

## 2021-02-22 ENCOUNTER — Other Ambulatory Visit: Payer: Self-pay | Admitting: Family Medicine

## 2021-02-22 DIAGNOSIS — I10 Essential (primary) hypertension: Secondary | ICD-10-CM

## 2021-02-23 NOTE — Telephone Encounter (Signed)
05/17/2019-IUD in correct position Recommend leaving the IUD in for a 6th year and repeating an [facility] and LH next year  If the [facility] and LH is normal, would recommend replacing her IUD. If her [place] and LH is elevated, would leave the IUD in for another year. PAtient is amenable to this plan. Also discussed process to remove IUD when strings are not visible.

## 2021-03-03 ENCOUNTER — Other Ambulatory Visit: Payer: Self-pay

## 2021-03-03 ENCOUNTER — Encounter: Payer: Self-pay | Admitting: Family Medicine

## 2021-03-03 ENCOUNTER — Ambulatory Visit (INDEPENDENT_AMBULATORY_CARE_PROVIDER_SITE_OTHER): Payer: BC Managed Care – PPO | Admitting: Family Medicine

## 2021-03-03 VITALS — BP 136/78 | HR 60 | Resp 16 | Ht 64.0 in | Wt 195.7 lb

## 2021-03-03 DIAGNOSIS — Z1211 Encounter for screening for malignant neoplasm of colon: Secondary | ICD-10-CM

## 2021-03-03 DIAGNOSIS — Z0000 Encounter for general adult medical examination without abnormal findings: Secondary | ICD-10-CM | POA: Diagnosis not present

## 2021-03-03 DIAGNOSIS — I10 Essential (primary) hypertension: Secondary | ICD-10-CM

## 2021-03-03 DIAGNOSIS — Z23 Encounter for immunization: Secondary | ICD-10-CM | POA: Diagnosis not present

## 2021-03-03 MED ORDER — ZOSTER VAC RECOMB ADJUVANTED 50 MCG/0.5ML IM SUSR: 0.5000 mL | Freq: Once | INTRAMUSCULAR | 0 refills | Status: AC

## 2021-03-03 NOTE — Progress Notes (Signed)
Complete physical exam   Patient: Monica Nelson   DOB: October 10, 1970   50 y.o. Female  MRN: [id number] Visit Date: 03/03/2021  Today's healthcare provider: Gwyneth Sprout, FNP   Chief Complaint  Patient presents with   Annual Exam   Subjective    HPI  Monica Nelson is a 50 y.o. female who presents today for a complete physical exam.  She reports consuming a general diet. Home exercise routine includes walking. She generally feels well. She reports sleeping well. She does not have additional problems to discuss today.  Last Reported Pap- 05/04/20 Mammo- scheduled 04/02/21 Past Medical History:  Diagnosis Date   Allergic rhinitis    Complex endometrial hyperplasia without atypia    currently treat with Mirena   Hypertension    Past Surgical History:  Procedure Laterality Date   BREAST BIOPSY Left 02/04/2014 x 2   negative   ENDOMETRIAL BIOPSY     May 2010 complex hyperplasia, Nov 2011 prolif endometrium, 2014 prolif   HYSTEROSCOPY WITH D & C  2010   Social History   Socioeconomic History   Marital status: Single    Spouse name: Not on file   Number of children: Not on file   Years of education: Not on file   Highest education level: Not on file  Occupational History   Occupation: Development worker, international aid  Tobacco Use   Smoking status: Never   Smokeless tobacco: Never  Vaping Use   Vaping Use: Never used  Substance and Sexual Activity   Alcohol use: Yes    Comment: occasional   Drug use: No   Sexual activity: Yes    Birth control/protection: I.U.D.  Other Topics Concern   Not on file  Social History Narrative   Also works in Management consultant office   Social Determinants of Health   Financial Resource Strain: Not on file  Food Insecurity: Not on file  Transportation Needs: Not on file  Physical Activity: Not on file  Stress: Not on file  Social Connections: Not on file  Intimate Partner Violence: Not on file   Family Status  Relation Name  Status   Mother  Alive   Father  Deceased at age 56       bone cancer   Neg Hx  (Not Specified)   Family History  Problem Relation Age of Onset   Healthy Mother    Thyroid disease Mother    Bone cancer Father    Breast cancer Neg Hx    No Known Allergies  Patient Care Team: Gwyneth Sprout, FNP as PCP - General (Family Medicine)   Medications: Outpatient Medications Prior to Visit  Medication Sig   Ascorbic Acid (VITAMIN C) 1000 MG tablet Take 1,000 mg by mouth daily.    Calcium Carbonate-Vitamin D 600-200 MG-UNIT TABS Take 1 tablet by mouth daily.   cetirizine (ZYRTEC) 10 MG tablet Take 10 mg by mouth daily.   fluticasone (FLONASE) 50 MCG/ACT nasal spray SPRAY 2 SPRAYS INTO EACH NOSTRIL EVERY DAY   ibuprofen (ADVIL) 800 MG tablet TAKE 1 TABLET (800 MG TOTAL) BY MOUTH 2 (TWO) TIMES DAILY.   levonorgestrel (MIRENA) 20 MCG/24HR IUD MIRENA, 20MCG/24HR (Intrauterine Intrauterine Device)  for 0 days  Quantity: 0.00;  Refills: 0   Ordered :19-Nov-2009  Ashley Royalty ;  Started 29-Jun-2009 Active Comments: DX: 626.2   lisinopril (ZESTRIL) 20 MG tablet Take 1 tablet (20 mg total) by mouth daily.   Misc Natural Products (OSTEO  BI-FLEX ADV JOINT SHIELD) TABS Take 2 tablets by mouth daily.    Red Yeast Rice 600 MG CAPS Take 1-2 capsules by mouth daily.   [DISCONTINUED] amoxicillin-clavulanate (AUGMENTIN) 875-125 MG tablet Take 1 tablet by mouth 2 (two) times daily.   [DISCONTINUED] guaiFENesin-dextromethorphan (ROBITUSSIN DM) 100-10 MG/5ML syrup Take 10 mLs by mouth every 4 (four) hours as needed for cough.   [DISCONTINUED] predniSONE (DELTASONE) 20 MG tablet Take 1 tablet (20 mg total) by mouth daily with breakfast.   No facility-administered medications prior to visit.    Review of Systems  HENT:  Positive for sinus pressure.   Musculoskeletal:  Positive for arthralgias.  All other systems reviewed and are negative.    Objective    BP 136/78    Pulse 60    Resp 16    Ht 5\' 4"   (1.626 m)    Wt 195 lb 11.2 oz (88.8 kg)    SpO2 100%    BMI 33.59 kg/m    Physical Exam Vitals and nursing note reviewed.  Constitutional:      General: She is awake. She is not in acute distress.    Appearance: Normal appearance. She is well-developed and well-groomed. She is obese. She is not ill-appearing, toxic-appearing or diaphoretic.  HENT:     Head: Normocephalic and atraumatic.     Jaw: There is normal jaw occlusion. No trismus, tenderness, swelling or pain on movement.     Right Ear: Hearing, tympanic membrane, ear canal and external ear normal. There is no impacted cerumen.     Left Ear: Hearing, tympanic membrane, ear canal and external ear normal. There is no impacted cerumen.     Nose: Nose normal. No congestion or rhinorrhea.     Right Turbinates: Not enlarged, swollen or pale.     Left Turbinates: Not enlarged, swollen or pale.     Right Sinus: No maxillary sinus tenderness or frontal sinus tenderness.     Left Sinus: No maxillary sinus tenderness or frontal sinus tenderness.     Mouth/Throat:     Lips: Pink.     Mouth: Mucous membranes are moist. No injury.     Tongue: No lesions.     Pharynx: Oropharynx is clear. Uvula midline. No pharyngeal swelling, oropharyngeal exudate, posterior oropharyngeal erythema or uvula swelling.     Tonsils: No tonsillar exudate or tonsillar abscesses.  Eyes:     General: Lids are normal. Lids are everted, no foreign bodies appreciated. Vision grossly intact. Gaze aligned appropriately. No allergic shiner or visual field deficit.       Right eye: No discharge.        Left eye: No discharge.     Extraocular Movements: Extraocular movements intact.     Conjunctiva/sclera: Conjunctivae normal.     Right eye: Right conjunctiva is not injected. No exudate.    Left eye: Left conjunctiva is not injected. No exudate.    Pupils: Pupils are equal, round, and reactive to light.  Neck:     Thyroid: No thyroid mass, thyromegaly or thyroid  tenderness.     Vascular: No carotid bruit.     Trachea: Trachea normal.  Cardiovascular:     Rate and Rhythm: Normal rate and regular rhythm.     Pulses: Normal pulses.          Carotid pulses are 2+ on the right side and 2+ on the left side.      Radial pulses are 2+ on the right side and 2+  on the left side.       Dorsalis pedis pulses are 2+ on the right side and 2+ on the left side.       Posterior tibial pulses are 2+ on the right side and 2+ on the left side.     Heart sounds: Normal heart sounds, S1 normal and S2 normal. No murmur heard.   No friction rub. No gallop.  Pulmonary:     Effort: Pulmonary effort is normal. No respiratory distress.     Breath sounds: Normal breath sounds and air entry. No stridor. No wheezing, rhonchi or rales.  Chest:     Chest wall: No tenderness.     Comments: Breasts: breasts appear normal, no suspicious masses, no skin or nipple changes or axillary nodes, right breast normal without mass, skin or nipple changes or axillary nodes, left breast normal without mass, skin or nipple changes or axillary nodes, risk and benefit of breast self-exam was discussed  Abdominal:     General: Abdomen is flat. Bowel sounds are normal. There is no distension.     Palpations: Abdomen is soft. There is no mass.     Tenderness: There is no abdominal tenderness. There is no right CVA tenderness, left CVA tenderness, guarding or rebound.     Hernia: No hernia is present.  Genitourinary:    Comments: Exam deferred; denies complaints Musculoskeletal:        General: No swelling, tenderness, deformity or signs of injury. Normal range of motion.     Cervical back: Full passive range of motion without pain, normal range of motion and neck supple. No edema, rigidity or tenderness. No muscular tenderness.     Right lower leg: No edema.     Left lower leg: No edema.  Lymphadenopathy:     Cervical: No cervical adenopathy.     Right cervical: No superficial, deep or  posterior cervical adenopathy.    Left cervical: No superficial, deep or posterior cervical adenopathy.  Skin:    General: Skin is warm and dry.     Capillary Refill: Capillary refill takes less than 2 seconds.     Coloration: Skin is not jaundiced or pale.     Findings: No bruising, erythema, lesion or rash.  Neurological:     General: No focal deficit present.     Mental Status: She is alert and oriented to person, place, and time. Mental status is at baseline.     GCS: GCS eye subscore is 4. GCS verbal subscore is 5. GCS motor subscore is 6.     Sensory: Sensation is intact. No sensory deficit.     Motor: Motor function is intact. No weakness.     Coordination: Coordination is intact. Coordination normal.     Gait: Gait is intact. Gait normal.  Psychiatric:        Attention and Perception: Attention and perception normal.        Mood and Affect: Mood and affect normal.        Speech: Speech normal.        Behavior: Behavior normal. Behavior is cooperative.        Thought Content: Thought content normal.        Cognition and Memory: Cognition and memory normal.        Judgment: Judgment normal.    Last depression screening scores PHQ 2/9 Scores 03/03/2021 02/17/2020 02/11/2019  PHQ - 2 Score 0 0 0  PHQ- 9 Score 0 - 0   Last fall  risk screening Fall Risk  02/17/2020  Falls in the past year? 0  Number falls in past yr: 0  Injury with Fall? 0  Risk for fall due to : No Fall Risks  Follow up Falls evaluation completed   Last Audit-C alcohol use screening Alcohol Use Disorder Test (AUDIT) 03/03/2021  1. How often do you have a drink containing alcohol? 2  2. How many drinks containing alcohol do you have on a typical day when you are drinking? 0  3. How often do you have six or more drinks on one occasion? 2  AUDIT-C Score 4  Alcohol Brief Interventions/Follow-up -   A score of 3 or more in women, and 4 or more in men indicates increased risk for alcohol abuse, EXCEPT if  all of the points are from question 1   No results found for any visits on 03/03/21.  Assessment & Plan    Routine Health Maintenance and Physical Exam  Exercise Activities and Dietary recommendations  Goals      Exercise 150 minutes per week (moderate activity)        Immunization History  Administered Date(s) Administered   Influenza,inj,Quad PF,6+ Mos 03/07/2015, 02/07/2018, 12/28/2018, 01/07/2020, 12/31/2020   Td 05/07/2018   Tdap 07/29/2005    Health Maintenance  Topic Date Due   COVID-19 Vaccine (1) Never done   COLONOSCOPY (Pts 45-9yrs Insurance coverage will need to be confirmed)  Never done   Zoster Vaccines- Shingrix (1 of 2) Never done   MAMMOGRAM  04/01/2022   PAP SMEAR-Modifier  05/04/2025   TETANUS/TDAP  05/07/2028   INFLUENZA VACCINE  Completed   Hepatitis C Screening  Completed   HIV Screening  Completed   Pneumococcal Vaccine 39-2 Years old  Aged Out   HPV VACCINES  Aged Out    Discussed health benefits of physical activity, and encouraged her to engage in regular exercise appropriate for her age and condition.  Problem List Items Addressed This Visit       Cardiovascular and Mediastinum   Primary hypertension    Chronic, stable Continue to monitor Focus on diet control and lifestyle changes        Other   Annual physical exam - Primary    Due for eye exam- up to date on dental Things to do to keep yourself healthy  - Exercise at least 30-45 minutes a day, 3-4 days a week.  - Eat a low-fat diet with lots of fruits and vegetables, up to 7-9 servings per day.  - Seatbelts can save your life. Wear them always.  - Smoke detectors on every level of your home, check batteries every year.  - Eye Doctor - have an eye exam every 1-2 years  - Safe sex - if you may be exposed to STDs, use a condom.  - Alcohol -  If you drink, do it moderately, less than 2 drinks per day.  - [place]. Choose someone to speak for you if you are  not able.  - Depression is common in our stressful world.If you're feeling down or losing interest in things you normally enjoy, please come in for a visit.  - Violence - If anyone is threatening or hurting you, please call immediately.        Screening for colon cancer    Denies change in stool symptoms Request for colo guard repeat Educated on plan to do colonoscopy if colo guard is positive  Relevant Orders   Cologuard   Need for shingles vaccine    Written rx provided given concern for insurance related cost      Relevant Medications   Zoster Vaccine Adjuvanted [facility]) injection     Return in about 4 months (around 07/02/2021) for chonic disease management- blood work.     Vonna Kotyk, FNP, have reviewed all documentation for this visit. The documentation on 03/03/21 for the exam, diagnosis, procedures, and orders are all accurate and complete.    Gwyneth Sprout, [facility] [phone] (phone) [phone] (fax)  [place]

## 2021-03-03 NOTE — Assessment & Plan Note (Signed)
Chronic, stable Continue to monitor Focus on diet control and lifestyle changes

## 2021-03-03 NOTE — Assessment & Plan Note (Signed)
Denies change in stool symptoms Request for colo guard repeat Educated on plan to do colonoscopy if colo guard is positive

## 2021-03-03 NOTE — Patient Instructions (Signed)
The 10-year ASCVD risk score (Arnett DK, et al., 2019) is: 1.7%   Values used to calculate the score:     Age: 50 years     Sex: Female     Is Non-Hispanic African American: No     Diabetic: No     Tobacco smoker: No     Systolic Blood Pressure: 244 mmHg     Is BP treated: Yes     HDL Cholesterol: 57 mg/dL     Total Cholesterol: 195 mg/dL

## 2021-03-03 NOTE — Assessment & Plan Note (Signed)
Due for eye exam- up to date on dental Things to do to keep yourself healthy  - Exercise at least 30-45 minutes a day, 3-4 days a week.  - Eat a low-fat diet with lots of fruits and vegetables, up to 7-9 servings per day.  - Seatbelts can save your life. Wear them always.  - Smoke detectors on every level of your home, check batteries every year.  - Eye Doctor - have an eye exam every 1-2 years  - Safe sex - if you may be exposed to STDs, use a condom.  - Alcohol -  If you drink, do it moderately, less than 2 drinks per day.  - [place]. Choose someone to speak for you if you are not able.  - Depression is common in our stressful world.If you're feeling down or losing interest in things you normally enjoy, please come in for a visit.  - Violence - If anyone is threatening or hurting you, please call immediately.

## 2021-03-03 NOTE — Assessment & Plan Note (Signed)
Written rx provided given concern for insurance related cost

## 2021-03-18 DIAGNOSIS — Z1211 Encounter for screening for malignant neoplasm of colon: Secondary | ICD-10-CM | POA: Diagnosis not present

## 2021-03-24 ENCOUNTER — Encounter: Payer: Self-pay | Admitting: Family Medicine

## 2021-03-24 LAB — COLOGUARD: COLOGUARD: NEGATIVE

## 2021-03-26 ENCOUNTER — Other Ambulatory Visit: Payer: Self-pay | Admitting: Family Medicine

## 2021-03-26 DIAGNOSIS — H938X3 Other specified disorders of ear, bilateral: Secondary | ICD-10-CM | POA: Insufficient documentation

## 2021-03-26 MED ORDER — PREDNISONE 10 MG PO TABS: 10.0000 mg | ORAL_TABLET | Freq: Every day | ORAL | 0 refills | Status: DC

## 2021-03-30 ENCOUNTER — Encounter: Payer: Self-pay | Admitting: Physician Assistant

## 2021-03-30 ENCOUNTER — Encounter: Payer: BC Managed Care – PPO | Admitting: Family Medicine

## 2021-03-30 ENCOUNTER — Ambulatory Visit: Payer: BC Managed Care – PPO | Admitting: Physician Assistant

## 2021-03-30 ENCOUNTER — Other Ambulatory Visit: Payer: Self-pay

## 2021-03-30 VITALS — BP 175/82 | HR 63 | Temp 98.6°F | Ht 64.0 in | Wt 194.6 lb

## 2021-03-30 DIAGNOSIS — J0110 Acute frontal sinusitis, unspecified: Secondary | ICD-10-CM

## 2021-03-30 DIAGNOSIS — R051 Acute cough: Secondary | ICD-10-CM

## 2021-03-30 MED ORDER — AZELASTINE HCL 0.1 % NA SOLN: 2.0000 | Freq: Two times a day (BID) | NASAL | 2 refills | Status: AC

## 2021-03-30 MED ORDER — HYDROCOD POLST-CPM POLST ER 10-8 MG/5ML PO SUER: 5.0000 mL | Freq: Every day | ORAL | 0 refills | Status: DC

## 2021-03-30 MED ORDER — AMOXICILLIN-POT CLAVULANATE 875-125 MG PO TABS: 1.0000 | ORAL_TABLET | Freq: Two times a day (BID) | ORAL | 0 refills | Status: DC

## 2021-03-30 NOTE — Progress Notes (Signed)
Established patient visit   Patient: Monica Nelson   DOB: [birth year]   51 y.o. Female  MRN: [id number] Visit Date: 03/30/2021  Today's healthcare provider: Mikey Kirschner, PA-C   Chief Complaint  Patient presents with   Facial Pain   Cough   Ear Fullness   Subjective    HPI  [name] is a 51 y/o female who presents today with symptoms of cough, sinus pain, congestion, fluid in ears, headaches, for the last 7-8 days. She reports taking cold and flu, mucinex DM which made her dizzy so she stopped taking. Reports negative covid test Sunday. No one else at home sick, but many people at work sick.  Denies fevers, chills, body aches  Medications: Outpatient Medications Prior to Visit  Medication Sig   Ascorbic Acid (VITAMIN C) 1000 MG tablet Take 1,000 mg by mouth daily.    Calcium Carbonate-Vitamin D 600-200 MG-UNIT TABS Take 1 tablet by mouth daily.   cetirizine (ZYRTEC) 10 MG tablet Take 10 mg by mouth daily.   fluticasone (FLONASE) 50 MCG/ACT nasal spray SPRAY 2 SPRAYS INTO EACH NOSTRIL EVERY DAY   ibuprofen (ADVIL) 800 MG tablet TAKE 1 TABLET (800 MG TOTAL) BY MOUTH 2 (TWO) TIMES DAILY.   levonorgestrel (MIRENA) 20 MCG/24HR IUD MIRENA, 20MCG/24HR (Intrauterine Intrauterine Device)  for 0 days  Quantity: 0.00;  Refills: 0   Ordered :19-Nov-2009  Ashley Royalty ;  Started 29-Jun-2009 Active Comments: DX: 626.2   lisinopril (ZESTRIL) 20 MG tablet Take 1 tablet (20 mg total) by mouth daily.   Misc Natural Products (OSTEO BI-FLEX ADV JOINT SHIELD) TABS Take 2 tablets by mouth daily.    predniSONE (DELTASONE) 10 MG tablet Take 1 tablet (10 mg total) by mouth daily with breakfast.   Red Yeast Rice 600 MG CAPS Take 1-2 capsules by mouth daily.   No facility-administered medications prior to visit.    Review of Systems  Constitutional:  Positive for fatigue. Negative for fever.  HENT:  Positive for congestion, postnasal drip, rhinorrhea, sinus pressure and sinus pain.    Respiratory:  Positive for cough. Negative for shortness of breath.   Cardiovascular:  Negative for chest pain.  Neurological:  Positive for headaches.      Objective    BP (!) 175/82 (BP Location: Left Arm, Patient Position: Sitting, Cuff Size: Large)    Pulse 63    Temp 98.6 F (37 C) (Oral)    Ht 5\' 4"  (1.626 m)    Wt 194 lb 9.6 oz (88.3 kg)    SpO2 98%    BMI 33.40 kg/m    Physical Exam Constitutional:      General: She is awake.     Appearance: She is well-developed.  HENT:     Head: Normocephalic.     Right Ear: Tympanic membrane normal.     Left Ear: Tympanic membrane normal.     Ears:     Comments: Fluid behind bilateral ears, no bulging or erythema    Nose: Congestion present.     Mouth/Throat:     Mouth: Mucous membranes are dry.     Pharynx: Posterior oropharyngeal erythema present.  Eyes:     Conjunctiva/sclera: Conjunctivae normal.  Cardiovascular:     Rate and Rhythm: Normal rate and regular rhythm.     Heart sounds: Normal heart sounds.  Pulmonary:     Effort: Pulmonary effort is normal.     Breath sounds: Normal breath sounds.  Skin:    General:  Skin is warm.  Neurological:     Mental Status: She is alert and oriented to person, place, and time.  Psychiatric:        Attention and Perception: Attention normal.        Mood and Affect: Mood normal.        Speech: Speech normal.        Behavior: Behavior is cooperative.      No results found for any visits on 03/30/21.  Assessment & Plan     Acute sinusitis Rx augmentin Rx tussinex for cough Rx azelastine for pnd Advised increase fluids d/c all otc cough meds d/t inc BP today  2. High blood pressure reading Pt will return in 1 mo to check bp  Does not have bp cuff at home  Return in about 1 month (around 04/30/2021) for hypertension--nurse visit.      I, Mikey Kirschner, PA-C have reviewed all documentation for this visit. The documentation on  1/10/2023for the exam, diagnosis, procedures,  and orders are all accurate and complete.    Mikey Kirschner, PA-C  [facility] [phone] (phone) [phone] (fax)  [place]

## 2021-04-02 ENCOUNTER — Other Ambulatory Visit: Payer: Self-pay

## 2021-04-02 ENCOUNTER — Ambulatory Visit
Admission: RE | Admit: 2021-04-02 | Discharge: 2021-04-02 | Disposition: A | Discharge status: Home / Self Care | Payer: BC Managed Care – PPO | Source: Ambulatory Visit | Attending: Family Medicine | Admitting: Family Medicine

## 2021-04-02 DIAGNOSIS — Z1231 Encounter for screening mammogram for malignant neoplasm of breast: Secondary | ICD-10-CM

## 2021-04-09 ENCOUNTER — Encounter: Payer: Self-pay | Admitting: Physician Assistant

## 2021-04-09 NOTE — Telephone Encounter (Signed)
Will forward to PCP for review

## 2021-04-20 ENCOUNTER — Encounter: Payer: Self-pay | Admitting: Physician Assistant

## 2021-04-20 ENCOUNTER — Other Ambulatory Visit: Payer: Self-pay | Admitting: Physician Assistant

## 2021-04-20 ENCOUNTER — Ambulatory Visit: Payer: Self-pay

## 2021-04-20 DIAGNOSIS — J0110 Acute frontal sinusitis, unspecified: Secondary | ICD-10-CM

## 2021-04-20 MED ORDER — DOXYCYCLINE HYCLATE 100 MG PO TABS: 100.0000 mg | ORAL_TABLET | Freq: Two times a day (BID) | ORAL | 0 refills | Status: AC

## 2021-04-20 NOTE — Telephone Encounter (Signed)
Patient called in to inform Sharen Counter that she may need another round of antibiotics and cough medication since her symptoms are not completely gone and her cough is back waking her at like 2 AM in the morning from non stop coughing also her ears still feel like it has fluid in it. Please advise and call patient at Ph# [phone]    Chief Complaint: Productive cough Symptoms: Ear Frequency: Started beginning of the month Pertinent Negatives: Patient denies fever Disposition: [] ED /[] Urgent Care (no appt availability in office) / [] Appointment(In office/virtual)/ []  [place] [facility]/ [] Home Care/ [] Refused Recommended Disposition /[] [place] [facility]/ [x]  Follow-up with PCP Additional Notes: Pt. Declines appointment. Request "another round of antibiotics and cough medicine be sent to pharmacy and then if I'm not better, I'll come in." Please advise pt.   Answer Assessment - Initial Assessment Questions 1. ONSET: "When did the cough begin?"      Beginning of month 2. SEVERITY: "How bad is the cough today?"      Yellow 3. SPUTUM: "Describe the color of your sputum" (none, dry cough; clear, white, yellow, green)     Yellow 4. HEMOPTYSIS: "Are you coughing up any blood?" If so ask: "How much?" (flecks, streaks, tablespoons, etc.)     No 5. DIFFICULTY BREATHING: "Are you having difficulty breathing?" If Yes, ask: "How bad is it?" (e.g., mild, moderate, severe)    - MILD: No SOB at rest, mild SOB with walking, speaks normally in sentences, can lie down, no retractions, pulse < 100.    - MODERATE: SOB at rest, SOB with minimal exertion and prefers to sit, cannot lie down flat, speaks in phrases, mild retractions, audible wheezing, pulse 100-120.    - SEVERE: Very SOB at rest, speaks in single words, struggling to breathe, sitting hunched forward, retractions, pulse > 120      No 6. FEVER: "Do you have a fever?" If Yes, ask: "What is your temperature, how was it measured, and when  did it start?"     No 7. CARDIAC HISTORY: "Do you have any history of heart disease?" (e.g., heart attack, congestive heart failure)      No 8. LUNG HISTORY: "Do you have any history of lung disease?"  (e.g., pulmonary embolus, asthma, emphysema)     No 9. PE RISK FACTORS: "Do you have a history of blood clots?" (or: recent major surgery, recent prolonged travel, bedridden)     No 10. OTHER SYMPTOMS: "Do you have any other symptoms?" (e.g., runny nose, wheezing, chest pain)       Ear pain 11. PREGNANCY: "Is there any chance you are pregnant?" "When was your last menstrual period?"       No 12. TRAVEL: "Have you traveled out of the country in the last month?" (e.g., travel history, exposures)       No  Protocols used: Cough - Acute Productive-A-AH

## 2021-04-21 ENCOUNTER — Ambulatory Visit: Payer: Self-pay | Admitting: *Deleted

## 2021-04-21 ENCOUNTER — Other Ambulatory Visit: Payer: Self-pay | Admitting: Physician Assistant

## 2021-04-21 DIAGNOSIS — J0110 Acute frontal sinusitis, unspecified: Secondary | ICD-10-CM

## 2021-04-21 DIAGNOSIS — R051 Acute cough: Secondary | ICD-10-CM

## 2021-04-21 MED ORDER — HYDROCOD POLI-CHLORPHE POLI ER 10-8 MG/5ML PO SUER: 5.0000 mL | Freq: Every evening | ORAL | 0 refills | Status: DC | PRN

## 2021-04-21 NOTE — Telephone Encounter (Signed)
°  Chief Complaint: Requesting a refill of the hydrocodone cough medication due to coughing a lot.   Also asked if the doxycycline would clear up the congestion in her sinuses and ears.  Saw Mikey Kirschner for sinus infection and ear infection but still having a lot of congestion. Symptoms: fluid in ears and sinus congestion. Frequency: coughing worse at night Pertinent Negatives: Patient denies N/A Disposition: [] ED /[] Urgent Care (no appt availability in office) / [] Appointment(In office/virtual)/ []  [place] [facility]/ [] Home Care/ [] Refused Recommended Disposition /[] [place] [facility]/ [x]  Follow-up with PCP Additional Notes: I have sent a message to [facility] with her request for the hydrocodone cough medication.   She has had this before and it worked well especially for the night time coughing.

## 2021-04-21 NOTE — Telephone Encounter (Signed)
Reason for Disposition  [1] Caller has URGENT medicine question about med that PCP or specialist prescribed AND [2] triager unable to answer question    Requesting cough medication hydrocodone refill  Answer Assessment - Initial Assessment Questions 1. NAME of MEDICATION: "What medicine are you calling about?"     Doxycycline 2. QUESTION: "What is your question?" (e.g., double dose of medicine, side effect)     Will this help with the congestion in my sinuses and ears? 3. PRESCRIBING HCP: "Who prescribed it?" Reason: if prescribed by specialist call should be referred to that group.     Mikey Kirschner, PA-C 4. SYMPTOMS: "Do you have any symptoms?"     Nasal congestion and fluid in my ears 5. SEVERITY: If symptoms are present, ask "Are they mild, moderate or severe?"     Not asked 6. PREGNANCY:  "Is there any chance that you are pregnant?" "When was your last menstrual period?"     Not asked  Protocols used: Medication Question Call-A-AH

## 2021-05-05 ENCOUNTER — Encounter: Payer: Self-pay | Admitting: Family Medicine

## 2021-05-17 ENCOUNTER — Ambulatory Visit: Payer: BC Managed Care – PPO | Admitting: Physician Assistant

## 2021-05-17 ENCOUNTER — Other Ambulatory Visit: Payer: Self-pay

## 2021-05-17 ENCOUNTER — Other Ambulatory Visit: Payer: Self-pay | Admitting: Physician Assistant

## 2021-05-17 DIAGNOSIS — D649 Anemia, unspecified: Secondary | ICD-10-CM | POA: Diagnosis not present

## 2021-05-17 NOTE — Progress Notes (Signed)
Pt reported fingerstick hemoglobin when donating blood was 11 and then at repeat 9 .

## 2021-05-18 LAB — CBC
Hematocrit: 40 % (ref 34.0–46.6)
Hemoglobin: 13.4 g/dL (ref 11.1–15.9)
MCH: 29.5 pg (ref 26.6–33.0)
MCHC: 33.5 g/dL (ref 31.5–35.7)
MCV: 88 fL (ref 79–97)
Platelets: 247 10*3/uL (ref 150–450)
RBC: 4.55 x10E6/uL (ref 3.77–5.28)
RDW: 13.1 % (ref 11.7–15.4)
WBC: 10.1 10*3/uL (ref 3.4–10.8)

## 2021-05-18 LAB — VITAMIN B12: Vitamin B-12: 320 pg/mL (ref 232–1245)

## 2021-05-18 LAB — IRON,TIBC AND FERRITIN PANEL
Ferritin: 55 ng/mL (ref 15–150)
Iron Saturation: 18 % (ref 15–55)
Iron: 70 ug/dL (ref 27–159)
Total Iron Binding Capacity: 394 ug/dL (ref 250–450)
UIBC: 324 ug/dL (ref 131–425)

## 2021-05-18 NOTE — Progress Notes (Signed)
Error

## 2021-06-01 ENCOUNTER — Other Ambulatory Visit: Payer: Self-pay | Admitting: Physician Assistant

## 2021-06-01 DIAGNOSIS — M2550 Pain in unspecified joint: Secondary | ICD-10-CM

## 2021-07-02 ENCOUNTER — Ambulatory Visit: Payer: BC Managed Care – PPO | Admitting: Family Medicine

## 2021-07-13 NOTE — Progress Notes (Signed)
?  ? ? ?I,Roshena L Chambers,acting as a scribe for Gwyneth Sprout, FNP.,have documented all relevant documentation on the behalf of Gwyneth Sprout, FNP,as directed by  Gwyneth Sprout, FNP while in the presence of Gwyneth Sprout, FNP.  ? ?Established patient visit ? ? ?Patient: Monica Nelson   DOB: 10-09-1970   51 y.o. Female  MRN: [id number] ?Visit Date: 07/14/2021 ? ?Today's healthcare provider: Gwyneth Sprout, FNP  ?Re Introduced to nurse practitioner role and practice setting.  All questions answered.  Discussed provider/patient relationship and expectations. ? ? ?Chief Complaint  ?Patient presents with  ? Hypertension  ? ?Subjective  ?  ?HPI  ?Hypertension, follow-up ? ?BP Readings from Last 3 Encounters:  ?07/14/21 138/88  ?05/17/21 119/69  ?03/30/21 (!) 175/82  ? Wt Readings from Last 3 Encounters:  ?07/14/21 202 lb (91.6 kg)  ?03/30/21 194 lb 9.6 oz (88.3 kg)  ?03/03/21 195 lb 11.2 oz (88.8 kg)  ?  ? ?She was last seen for hypertension 3 months ago.  ?BP at that visit was 136/78. Management since that visit includes none. ? ?She reports good compliance with treatment. ?She is not having side effects.  ?She is following a Regular diet. ?She is exercising. ?She does not smoke. ? ?Use of agents associated with hypertension: none.  ? ?Outside blood pressures are not checked. ?Symptoms: ?No chest pain No chest pressure  ?No palpitations No syncope  ?No dyspnea No orthopnea  ?No paroxysmal nocturnal dyspnea No lower extremity edema  ? ?Pertinent labs ?Lab Results  ?Component Value Date  ? CHOL 195 11/27/2020  ? HDL 57 11/27/2020  ? LDLCALC 127 (H) 11/27/2020  ? TRIG 60 11/27/2020  ? CHOLHDL 3.4 11/27/2020  ? Lab Results  ?Component Value Date  ? NA 139 11/27/2020  ? K 4.8 11/27/2020  ? CREATININE 0.76 11/27/2020  ? EGFR 96 11/27/2020  ? GLUCOSE 89 11/27/2020  ? TSH 1.800 11/27/2020  ?  ? ?The 10-year ASCVD risk score (Arnett DK, et al., 2019) is:  1.8% ? ?---------------------------------------------------------------------------------------------------  ? ?Medications: ?Outpatient Medications Prior to Visit  ?Medication Sig  ? Ascorbic Acid (VITAMIN C) 1000 MG tablet Take 1,000 mg by mouth daily.   ? azelastine (ASTELIN) 0.1 % nasal spray Place 2 sprays into both nostrils 2 (two) times daily. Use in each nostril as directed  ? Calcium Carbonate-Vitamin D 600-200 MG-UNIT TABS Take 1 tablet by mouth daily.  ? cetirizine (ZYRTEC) 10 MG tablet Take 10 mg by mouth daily.  ? chlorpheniramine-HYDROcodone (TUSSIONEX PENNKINETIC ER) 10-8 MG/5ML Take 5 mLs by mouth at bedtime as needed for cough.  ? fluticasone (FLONASE) 50 MCG/ACT nasal spray SPRAY 2 SPRAYS INTO EACH NOSTRIL EVERY DAY  ? ibuprofen (ADVIL) 800 MG tablet TAKE 1 TABLET (800 MG TOTAL) BY MOUTH 2 (TWO) TIMES DAILY.  ? levonorgestrel (MIRENA) 20 MCG/24HR IUD MIRENA, 20MCG/24HR (Intrauterine Intrauterine Device) ? for 0 days ? Quantity: 0.00;  Refills: 0 ? ? Ordered :19-Nov-2009 ? Ashley Royalty ;  Started 29-Jun-2009 ?Active Comments: DX: 626.2  ? lisinopril (ZESTRIL) 20 MG tablet Take 1 tablet (20 mg total) by mouth daily.  ? Misc Natural Products (OSTEO BI-FLEX ADV JOINT SHIELD) TABS Take 2 tablets by mouth daily.   ? Red Yeast Rice 600 MG CAPS Take 1-2 capsules by mouth daily.  ? [DISCONTINUED] predniSONE (DELTASONE) 10 MG tablet Take 1 tablet (10 mg total) by mouth daily with breakfast.  ? ?No facility-administered medications prior to visit.  ? ? ?  Review of Systems  ?Constitutional:  Negative for appetite change, chills, fatigue and fever.  ?Respiratory:  Negative for chest tightness and shortness of breath.   ?Cardiovascular:  Negative for chest pain and palpitations.  ?Gastrointestinal:  Negative for abdominal pain, nausea and vomiting.  ?Neurological:  Negative for dizziness and weakness.  ? ? ?  Objective  ?  ?BP 138/88 (BP Location: Right Arm, Patient Position: Sitting, Cuff Size: Normal)   Pulse  77   Temp 98.4 ?F (36.9 ?C) (Oral)   Resp 14   Wt 202 lb (91.6 kg)   SpO2 99% Comment: room air  BMI 34.67 kg/m?  ? ? ?Physical Exam ?Vitals and nursing note reviewed.  ?Constitutional:   ?   General: She is not in acute distress. ?   Appearance: Normal appearance. She is obese. She is not ill-appearing, toxic-appearing or diaphoretic.  ?HENT:  ?   Head: Normocephalic and atraumatic.  ?Cardiovascular:  ?   Rate and Rhythm: Normal rate and regular rhythm.  ?   Pulses: Normal pulses.  ?   Heart sounds: Normal heart sounds. No murmur heard. ?  No friction rub. No gallop.  ?Pulmonary:  ?   Effort: Pulmonary effort is normal. No respiratory distress.  ?   Breath sounds: Normal breath sounds. No stridor. No wheezing, rhonchi or rales.  ?Chest:  ?   Chest wall: No tenderness.  ?Abdominal:  ?   General: Bowel sounds are normal.  ?   Palpations: Abdomen is soft.  ?Musculoskeletal:     ?   General: No swelling, tenderness, deformity or signs of injury. Normal range of motion.  ?   Right lower leg: No edema.  ?   Left lower leg: No edema.  ?Skin: ?   General: Skin is warm and dry.  ?   Capillary Refill: Capillary refill takes less than 2 seconds.  ?   Coloration: Skin is not jaundiced or pale.  ?   Findings: No bruising, erythema, lesion or rash.  ?Neurological:  ?   General: No focal deficit present.  ?   Mental Status: She is alert and oriented to person, place, and time. Mental status is at baseline.  ?   Cranial Nerves: No cranial nerve deficit.  ?   Sensory: No sensory deficit.  ?   Motor: No weakness.  ?   Coordination: Coordination normal.  ?Psychiatric:     ?   Mood and Affect: Mood normal.     ?   Behavior: Behavior normal.     ?   Thought Content: Thought content normal.     ?   Judgment: Judgment normal.  ?  ? ?No results found for any visits on 07/14/21. ? Assessment & Plan  ?  ? ?Problem List Items Addressed This Visit   ? ?  ? Cardiovascular and Mediastinum  ? Primary hypertension - Primary  ?  Chronic,  stable; BP has elevated since recent weight gain ?Denies CP ?Denies SOB/ DOE ?Denies low blood pressure/hypotension ?Denies vision changes ?No LE Edema noted on exam ?Continue medication, Lisinopril 20 mg QD ?Denies side effects ?RTC for CPE 02/2022 ?Seek emergent care if you develop chest pain or chest pressure ? ?  ?  ?  ? Other  ? Elevated LDL cholesterol level  ?  Chronic, stable ?Not on statin ?Reviewed ASCVD risk score ?Pt has been taking Red Yeast Rice 627m capsules 1-2 times/day; advised that use of Mevacor, would be the same medication and would  be formulated and safer ?Patient plans to start diet low in saturated fat and engage in regular exercise - 30 min at least 5 times per week ? ? ?  ?  ? Obesity (BMI 30.0-34.9)  ?  Chronic, worsening ?Body mass index is 34.67 kg/m?. ?Discussed importance of healthy weight management ?Discussed diet and exercise ?Notes worsening knee pain and in case in BP since gaining weight ?Does not wish to start medication at this time for weight loss assistance; denies additional labs at this time  ?  ?  ? ? ?Return in about 8 months (around 03/15/2022) for annual examination.  ?   ? ?I, Gwyneth Sprout, FNP, have reviewed all documentation for this visit. The documentation on 07/14/21 for the exam, diagnosis, procedures, and orders are all accurate and complete. ? ?Gwyneth Sprout, FNP  ?[place] ?[phone] (phone) ?[phone] (fax) ? ?[facility] ?

## 2021-07-14 ENCOUNTER — Ambulatory Visit: Payer: BC Managed Care – PPO | Admitting: Family Medicine

## 2021-07-14 ENCOUNTER — Encounter: Payer: Self-pay | Admitting: Family Medicine

## 2021-07-14 VITALS — BP 138/88 | HR 77 | Temp 98.4°F | Resp 14 | Wt 202.0 lb

## 2021-07-14 DIAGNOSIS — I10 Essential (primary) hypertension: Secondary | ICD-10-CM

## 2021-07-14 DIAGNOSIS — E669 Obesity, unspecified: Secondary | ICD-10-CM | POA: Diagnosis not present

## 2021-07-14 DIAGNOSIS — E7800 Pure hypercholesterolemia, unspecified: Secondary | ICD-10-CM | POA: Diagnosis not present

## 2021-07-14 NOTE — Assessment & Plan Note (Signed)
Chronic, stable; BP has elevated since recent weight gain ?Denies CP ?Denies SOB/ DOE ?Denies low blood pressure/hypotension ?Denies vision changes ?No LE Edema noted on exam ?Continue medication, Lisinopril 20 mg QD ?Denies side effects ?RTC for CPE 02/2022 ?Seek emergent care if you develop chest pain or chest pressure ? ?

## 2021-07-14 NOTE — Patient Instructions (Signed)
The 10-year ASCVD risk score (Arnett DK, et al., 2019) is: 1.8% ?  Values used to calculate the score: ?    Age: 52 years ?    Sex: Female ?    Is Non-Hispanic African American: No ?    Diabetic: No ?    Tobacco smoker: No ?    Systolic Blood Pressure: 897 mmHg ?    Is BP treated: Yes ?    HDL Cholesterol: 57 mg/dL ?    Total Cholesterol: 195 mg/dL ? ?

## 2021-07-14 NOTE — Assessment & Plan Note (Signed)
Chronic, worsening ?Body mass index is 34.67 kg/m?. ?Discussed importance of healthy weight management ?Discussed diet and exercise ?Notes worsening knee pain and in case in BP since gaining weight ?Does not wish to start medication at this time for weight loss assistance; denies additional labs at this time  ?

## 2021-07-14 NOTE — Assessment & Plan Note (Signed)
Chronic, stable ?Not on statin ?Reviewed ASCVD risk score ?Pt has been taking Red Yeast Rice '600mg'$  capsules 1-2 times/day; advised that use of Mevacor, would be the same medication and would be formulated and safer ?Patient plans to start diet low in saturated fat and engage in regular exercise - 30 min at least 5 times per week ? ?

## 2021-07-29 ENCOUNTER — Other Ambulatory Visit: Payer: Self-pay | Admitting: Physician Assistant

## 2021-07-29 DIAGNOSIS — M2550 Pain in unspecified joint: Secondary | ICD-10-CM

## 2021-08-04 ENCOUNTER — Other Ambulatory Visit: Payer: Self-pay | Admitting: Family Medicine

## 2021-08-04 DIAGNOSIS — M1990 Unspecified osteoarthritis, unspecified site: Secondary | ICD-10-CM

## 2021-08-04 MED ORDER — MELOXICAM 15 MG PO TABS: 15.0000 mg | ORAL_TABLET | Freq: Every day | ORAL | 3 refills | Status: DC

## 2021-08-04 NOTE — Progress Notes (Signed)
m °

## 2021-08-26 ENCOUNTER — Encounter: Payer: Self-pay | Admitting: Family Medicine

## 2021-08-26 ENCOUNTER — Other Ambulatory Visit: Payer: Self-pay | Admitting: Family Medicine

## 2021-08-26 DIAGNOSIS — M150 Primary generalized (osteo)arthritis: Secondary | ICD-10-CM

## 2021-08-26 DIAGNOSIS — M159 Polyosteoarthritis, unspecified: Secondary | ICD-10-CM

## 2021-08-26 MED ORDER — IBUPROFEN 800 MG PO TABS: 800.0000 mg | ORAL_TABLET | Freq: Three times a day (TID) | ORAL | 0 refills | Status: DC

## 2022-01-18 ENCOUNTER — Other Ambulatory Visit: Payer: Self-pay | Admitting: Family Medicine

## 2022-01-18 DIAGNOSIS — I10 Essential (primary) hypertension: Secondary | ICD-10-CM

## 2022-02-28 ENCOUNTER — Other Ambulatory Visit: Payer: Self-pay | Admitting: Family Medicine

## 2022-02-28 DIAGNOSIS — Z1231 Encounter for screening mammogram for malignant neoplasm of breast: Secondary | ICD-10-CM

## 2022-03-04 ENCOUNTER — Encounter: Payer: Self-pay | Admitting: Family Medicine

## 2022-03-04 ENCOUNTER — Ambulatory Visit (INDEPENDENT_AMBULATORY_CARE_PROVIDER_SITE_OTHER): Payer: BC Managed Care – PPO | Admitting: Family Medicine

## 2022-03-04 VITALS — BP 123/79 | HR 67 | Temp 98.2°F | Resp 16 | Ht 64.0 in | Wt 197.6 lb

## 2022-03-04 DIAGNOSIS — E669 Obesity, unspecified: Secondary | ICD-10-CM | POA: Diagnosis not present

## 2022-03-04 DIAGNOSIS — R7303 Prediabetes: Secondary | ICD-10-CM | POA: Diagnosis not present

## 2022-03-04 DIAGNOSIS — I10 Essential (primary) hypertension: Secondary | ICD-10-CM | POA: Diagnosis not present

## 2022-03-04 DIAGNOSIS — J0110 Acute frontal sinusitis, unspecified: Secondary | ICD-10-CM | POA: Diagnosis not present

## 2022-03-04 DIAGNOSIS — E7800 Pure hypercholesterolemia, unspecified: Secondary | ICD-10-CM

## 2022-03-04 DIAGNOSIS — Z0000 Encounter for general adult medical examination without abnormal findings: Secondary | ICD-10-CM | POA: Diagnosis not present

## 2022-03-04 DIAGNOSIS — R051 Acute cough: Secondary | ICD-10-CM

## 2022-03-04 DIAGNOSIS — Z1231 Encounter for screening mammogram for malignant neoplasm of breast: Secondary | ICD-10-CM | POA: Insufficient documentation

## 2022-03-04 MED ORDER — AMOXICILLIN-POT CLAVULANATE 875-125 MG PO TABS: 1.0000 | ORAL_TABLET | Freq: Two times a day (BID) | ORAL | 0 refills | Status: DC

## 2022-03-04 MED ORDER — HYDROCOD POLI-CHLORPHE POLI ER 10-8 MG/5ML PO SUER: 5.0000 mL | Freq: Every evening | ORAL | 0 refills | Status: DC | PRN

## 2022-03-04 NOTE — Assessment & Plan Note (Signed)
Acute, stable Has not been on Abx in 10 months + sinus complaints and drainage into throat Has continued Vit C, Astelin, Zyrtec and OTC BP safe meds RTC if not improved- will send to ENT for further assistance

## 2022-03-04 NOTE — Assessment & Plan Note (Signed)
Chronic, previously stable with diet and exercise Repeat A1c Recommend 6 month f/u Continue to recommend balanced, lower carb meals. Smaller meal size, adding snacks. Choosing water as drink of choice and increasing purposeful exercise.

## 2022-03-04 NOTE — Assessment & Plan Note (Signed)
In setting of recurrent sinus drainage; will refill Rx cough syrup to assist

## 2022-03-04 NOTE — Assessment & Plan Note (Signed)
Chronic, slight improvement Body mass index is 33.92 kg/m. Discussed importance of healthy weight management Discussed diet and exercise

## 2022-03-04 NOTE — Patient Instructions (Signed)
The CDC recommends two doses of Shingrix (the new shingles vaccine) separated by 2 to 6 months for adults age 50 years and older. I recommend checking with your insurance plan regarding coverage for this vaccine.    

## 2022-03-04 NOTE — Progress Notes (Signed)
I,Sulibeya S Dimas,acting as a Education administrator for Gwyneth Sprout, FNP.,have documented all relevant documentation on the behalf of Gwyneth Sprout, FNP,as directed by  Gwyneth Sprout, FNP while in the presence of Gwyneth Sprout, FNP.    Complete physical exam   Patient: Monica Nelson   DOB: 1970/07/21   51 y.o. Female  MRN: [id number] Visit Date: 03/04/2022  Today's healthcare provider: Gwyneth Sprout, FNP  Re Introduced to nurse practitioner role and practice setting.  All questions answered.  Discussed provider/patient relationship and expectations.   Chief Complaint  Patient presents with   Annual Exam   Subjective    Monica Nelson is a 51 y.o. female who presents today for a complete physical exam.  She reports consuming a general diet. Home exercise routine includes walking. She generally feels well. She reports sleeping well. She does have additional problems to discuss today.  HPI    Past Medical History:  Diagnosis Date   Allergic rhinitis    Complex endometrial hyperplasia without atypia    currently treat with Mirena   Hypertension    Past Surgical History:  Procedure Laterality Date   BREAST BIOPSY Left 02/04/2014 x 2   negative   ENDOMETRIAL BIOPSY     May 2010 complex hyperplasia, Nov 2011 prolif endometrium, 2014 prolif   HYSTEROSCOPY WITH D & C  2010   Social History   Socioeconomic History   Marital status: Single    Spouse name: Not on file   Number of children: Not on file   Years of education: Not on file   Highest education level: Not on file  Occupational History   Occupation: Development worker, international aid  Tobacco Use   Smoking status: Never   Smokeless tobacco: Never  Vaping Use   Vaping Use: Never used  Substance and Sexual Activity   Alcohol use: Yes    Comment: occasional   Drug use: No   Sexual activity: Yes    Birth control/protection: I.U.D.  Other Topics Concern   Not on file  Social History Narrative   Also works in Designer, multimedia office   Social Determinants of Health   Financial Resource Strain: Not on file  Food Insecurity: Not on file  Transportation Needs: Not on file  Physical Activity: Not on file  Stress: Not on file  Social Connections: Not on file  Intimate Partner Violence: Not on file   Family Status  Relation Name Status   Mother  Alive   Father  Deceased at age 66       bone cancer   Neg Hx  (Not Specified)   Family History  Problem Relation Age of Onset   Healthy Mother    Thyroid disease Mother    Bone cancer Father    Breast cancer Neg Hx    No Known Allergies  Patient Care Team: Gwyneth Sprout, FNP as PCP - General (Family Medicine)   Medications: Outpatient Medications Prior to Visit  Medication Sig   Ascorbic Acid (VITAMIN C) 1000 MG tablet Take 1,000 mg by mouth daily.    azelastine (ASTELIN) 0.1 % nasal spray Place 2 sprays into both nostrils 2 (two) times daily. Use in each nostril as directed   Calcium Carbonate-Vitamin D 600-200 MG-UNIT TABS Take 1 tablet by mouth daily.   cetirizine (ZYRTEC) 10 MG tablet Take 10 mg by mouth daily.   fluticasone (FLONASE) 50 MCG/ACT nasal spray SPRAY 2 SPRAYS INTO EACH NOSTRIL EVERY DAY  ibuprofen (ADVIL) 800 MG tablet Take 1 tablet (800 mg total) by mouth 3 (three) times daily.   levonorgestrel (MIRENA) 20 MCG/24HR IUD MIRENA, 20MCG/24HR (Intrauterine Intrauterine Device)  for 0 days  Quantity: 0.00;  Refills: 0   Ordered :19-Nov-2009  Ashley Royalty ;  Started 29-Jun-2009 Active Comments: DX: 626.2   lisinopril (ZESTRIL) 20 MG tablet TAKE ONE TABLET BY MOUTH DAILY   Misc Natural Products (OSTEO BI-FLEX ADV JOINT SHIELD) TABS Take 2 tablets by mouth daily.    Red Yeast Rice 600 MG CAPS Take 1-2 capsules by mouth daily.   [DISCONTINUED] chlorpheniramine-HYDROcodone (TUSSIONEX PENNKINETIC ER) 10-8 MG/5ML Take 5 mLs by mouth at bedtime as needed for cough.   No facility-administered medications prior to visit.    Review of  Systems  HENT:  Positive for sinus pressure.   All other systems reviewed and are negative.   Last CBC Lab Results  Component Value Date   WBC 10.1 05/17/2021   HGB 13.4 05/17/2021   HCT 40.0 05/17/2021   MCV 88 05/17/2021   MCH 29.5 05/17/2021   RDW 13.1 05/17/2021   PLT 247 29/79/8921   Last metabolic panel Lab Results  Component Value Date   GLUCOSE 89 11/27/2020   NA 139 11/27/2020   K 4.8 11/27/2020   CL 102 11/27/2020   CO2 23 11/27/2020   BUN 20 11/27/2020   CREATININE 0.76 11/27/2020   EGFR 96 11/27/2020   CALCIUM 9.7 11/27/2020   PROT 7.1 11/27/2020   ALBUMIN 4.5 11/27/2020   LABGLOB 2.6 11/27/2020   AGRATIO 1.7 11/27/2020   BILITOT 0.3 11/27/2020   ALKPHOS 77 11/27/2020   AST 17 11/27/2020   ALT 15 11/27/2020   Last lipids Lab Results  Component Value Date   CHOL 195 11/27/2020   HDL 57 11/27/2020   LDLCALC 127 (H) 11/27/2020   TRIG 60 11/27/2020   CHOLHDL 3.4 11/27/2020   Last hemoglobin A1c Lab Results  Component Value Date   HGBA1C 5.8 (H) 11/27/2020   Last thyroid functions Lab Results  Component Value Date   TSH 1.800 11/27/2020   Last vitamin D Lab Results  Component Value Date   VD25OH 37.8 11/27/2020   Last vitamin B12 and Folate Lab Results  Component Value Date   VITAMINB12 320 05/17/2021      Objective    BP 123/79 (BP Location: Left Arm, Patient Position: Sitting, Cuff Size: Large)   Pulse 67   Temp 98.2 F (36.8 C) (Oral)   Resp 16   Ht _0  (1.626 m)   Wt 197 lb 9.6 oz (89.6 kg)   BMI 33.92 kg/m   BP Readings from Last 3 Encounters:  03/04/22 123/79  07/14/21 138/88  05/17/21 119/69   Wt Readings from Last 3 Encounters:  03/04/22 197 lb 9.6 oz (89.6 kg)  07/14/21 202 lb (91.6 kg)  03/30/21 194 lb 9.6 oz (88.3 kg)       Physical Exam Vitals and nursing note reviewed.  Constitutional:      General: She is awake. She is not in acute distress.    Appearance: Normal appearance. She is well-developed  and well-groomed. She is obese. She is not ill-appearing, toxic-appearing or diaphoretic.  HENT:     Head: Normocephalic and atraumatic.     Jaw: There is normal jaw occlusion. No trismus, tenderness, swelling or pain on movement.     Right Ear: Hearing, tympanic membrane, ear canal and external ear normal. There is no impacted cerumen.  Left Ear: Hearing, tympanic membrane, ear canal and external ear normal. There is no impacted cerumen.     Nose: No congestion or rhinorrhea.     Right Turbinates: Not enlarged, swollen or pale.     Left Turbinates: Not enlarged, swollen or pale.     Right Sinus: Maxillary sinus tenderness and frontal sinus tenderness present.     Left Sinus: Maxillary sinus tenderness and frontal sinus tenderness present.     Mouth/Throat:     Lips: Pink.     Mouth: Mucous membranes are moist. No injury.     Tongue: No lesions.     Pharynx: Oropharynx is clear. Uvula midline. Posterior oropharyngeal erythema present. No pharyngeal swelling, oropharyngeal exudate or uvula swelling.     Tonsils: No tonsillar exudate or tonsillar abscesses.  Eyes:     General: Lids are normal. Lids are everted, no foreign bodies appreciated. Vision grossly intact. Gaze aligned appropriately. No allergic shiner or visual field deficit.       Right eye: No discharge.        Left eye: No discharge.     Extraocular Movements: Extraocular movements intact.     Conjunctiva/sclera: Conjunctivae normal.     Right eye: Right conjunctiva is not injected. No exudate.    Left eye: Left conjunctiva is not injected. No exudate.    Pupils: Pupils are equal, round, and reactive to light.  Neck:     Thyroid: No thyroid mass, thyromegaly or thyroid tenderness.     Vascular: No carotid bruit.     Trachea: Trachea normal.  Cardiovascular:     Rate and Rhythm: Normal rate and regular rhythm.     Pulses: Normal pulses.          Carotid pulses are 2+ on the right side and 2+ on the left side.      Radial  pulses are 2+ on the right side and 2+ on the left side.       Dorsalis pedis pulses are 2+ on the right side and 2+ on the left side.       Posterior tibial pulses are 2+ on the right side and 2+ on the left side.     Heart sounds: Normal heart sounds, S1 normal and S2 normal. No murmur heard.    No friction rub. No gallop.  Pulmonary:     Effort: Pulmonary effort is normal. No respiratory distress.     Breath sounds: Normal breath sounds and air entry. No stridor. No wheezing, rhonchi or rales.  Chest:     Chest wall: No tenderness.     Comments: Breasts: risk and benefit of breast self-exam was discussed, not examined. Mammogram due 03/2022  Abdominal:     General: Abdomen is flat. Bowel sounds are normal. There is no distension.     Palpations: Abdomen is soft. There is no mass.     Tenderness: There is no abdominal tenderness. There is no right CVA tenderness, left CVA tenderness, guarding or rebound.     Hernia: No hernia is present.     Comments: Cologuard due 03/18/2024  Genitourinary:    Comments: Exam deferred; denies complaints PAP due 05/04/2025 Musculoskeletal:        General: Deformity present. No swelling, tenderness or signs of injury. Normal range of motion.     Cervical back: Full passive range of motion without pain, normal range of motion and neck supple. No edema, rigidity or tenderness. No muscular tenderness.     Right  lower leg: No edema.     Left lower leg: No edema.     Comments: Bowing noted in knees; has family hx of OA in knees. Declines referral at this time.  Lymphadenopathy:     Cervical: No cervical adenopathy.     Right cervical: No superficial, deep or posterior cervical adenopathy.    Left cervical: No superficial, deep or posterior cervical adenopathy.  Skin:    General: Skin is warm and dry.     Capillary Refill: Capillary refill takes less than 2 seconds.     Coloration: Skin is not jaundiced or pale.     Findings: No bruising, erythema, lesion  or rash.  Neurological:     General: No focal deficit present.     Mental Status: She is alert and oriented to person, place, and time. Mental status is at baseline.     GCS: GCS eye subscore is 4. GCS verbal subscore is 5. GCS motor subscore is 6.     Sensory: Sensation is intact. No sensory deficit.     Motor: Motor function is intact. No weakness.     Coordination: Coordination is intact. Coordination normal.     Gait: Gait is intact. Gait normal.  Psychiatric:        Attention and Perception: Attention and perception normal.        Mood and Affect: Mood and affect normal.        Speech: Speech normal.        Behavior: Behavior normal. Behavior is cooperative.        Thought Content: Thought content normal.        Cognition and Memory: Cognition and memory normal.        Judgment: Judgment normal.     Last depression screening scores    03/04/2022    8:21 AM 07/14/2021    2:29 PM 03/03/2021    3:09 PM  PHQ 2/9 Scores  PHQ - 2 Score 0 0 0  PHQ- 9 Score 0 0 0   Last fall risk screening    03/04/2022    8:21 AM  [place] in the past year? 0  Number falls in past yr: 0  Injury with Fall? 0  Risk for fall due to : No Fall Risks  Follow up Falls evaluation completed   Last Audit-C alcohol use screening    03/04/2022    8:22 AM  Alcohol Use Disorder Test (AUDIT)  1. How often do you have a drink containing alcohol? 2  2. How many drinks containing alcohol do you have on a typical day when you are drinking? 0  3. How often do you have six or more drinks on one occasion? 1  AUDIT-C Score 3   A score of 3 or more in women, and 4 or more in men indicates increased risk for alcohol abuse, EXCEPT if all of the points are from question 1   No results found for any visits on 03/04/22.  Assessment & Plan    Routine Health Maintenance and Physical Exam  Exercise Activities and Dietary recommendations  Goals      Exercise 150 minutes per week (moderate activity)         Immunization History  Administered Date(s) Administered   COVID-19, mRNA, vaccine(Comirnaty)12 years and older 01/16/2022   Influenza,inj,Quad PF,6+ Mos 03/07/2015, 02/07/2018, 12/28/2018, 01/07/2020, 12/31/2020   Influenza-Unspecified 01/16/2022   Td 05/07/2018   Tdap 07/29/2005    Health Maintenance  Topic  Date Due   Zoster Vaccines- Shingrix (1 of 2) Never done   MAMMOGRAM  04/03/2023   Fecal DNA (Cologuard)  03/18/2024   PAP SMEAR-Modifier  05/04/2025   DTaP/Tdap/Td (3 - Td or Tdap) 05/07/2028   INFLUENZA VACCINE  Completed   COVID-19 Vaccine  Completed   Hepatitis C Screening  Completed   HIV Screening  Completed   HPV VACCINES  Aged Out    Discussed health benefits of physical activity, and encouraged her to engage in regular exercise appropriate for her age and condition.  Problem List Items Addressed This Visit       Cardiovascular and Mediastinum   Primary hypertension    Chronic, stable At goal of <140/<90 Repeat CBC and CMP Continue Lisinopril 20 mg to assist      Relevant Orders   CBC   Comprehensive Metabolic Panel (CMET)     Respiratory   Acute non-recurrent frontal sinusitis    Acute, stable Has not been on Abx in 10 months + sinus complaints and drainage into throat Has continued Vit C, Astelin, Zyrtec and OTC BP safe meds RTC if not improved- will send to ENT for further assistance      Relevant Medications   chlorpheniramine-HYDROcodone (TUSSIONEX) 10-8 MG/5ML   amoxicillin-clavulanate (AUGMENTIN) 875-125 MG tablet     Other   Acute cough    In setting of recurrent sinus drainage; will refill Rx cough syrup to assist       Relevant Medications   chlorpheniramine-HYDROcodone (TUSSIONEX) 10-8 MG/5ML   amoxicillin-clavulanate (AUGMENTIN) 875-125 MG tablet   Annual physical exam - Primary    UTD on dental, vision scheduled for early 2024 UTD on vaccines with exception of shingles UTD on Colon, PAP and mammo Things to do to keep  yourself healthy  - Exercise at least 30-45 minutes a day, 3-4 days a week.  - Eat a low-fat diet with lots of fruits and vegetables, up to 7-9 servings per day.  - Seatbelts can save your life. Wear them always.  - Smoke detectors on every level of your home, check batteries every year.  - Eye Doctor - have an eye exam every 1-2 years  - Safe sex - if you may be exposed to STDs, use a condom.  - Alcohol -  If you drink, do it moderately, less than 2 drinks per day.  - [place]. Choose someone to speak for you if you are not able.  - Depression is common in our stressful world.If you're feeling down or losing interest in things you normally enjoy, please come in for a visit.  - Violence - If anyone is threatening or hurting you, please call immediately.       Relevant Orders   CBC   Comprehensive Metabolic Panel (CMET)   Lipid panel   Hemoglobin A1c   Elevated LDL cholesterol level    Chronic, previously elevated at 127 Goal <100 recommend diet low in saturated fat and regular exercise - 30 min at least 5 times per week Continue red yeast rice OTC to assist Repeat LP      Relevant Orders   Lipid panel   Encounter for screening mammogram for malignant neoplasm of breast   Relevant Orders   MM 3D SCREEN BREAST BILATERAL   Obesity (BMI 30.0-34.9)    Chronic, slight improvement Body mass index is 33.92 kg/m. Discussed importance of healthy weight management Discussed diet and exercise       Relevant Orders  CBC   Comprehensive Metabolic Panel (CMET)   Lipid panel   Hemoglobin A1c   Prediabetes    Chronic, previously stable with diet and exercise Repeat A1c Recommend 6 month f/u Continue to recommend balanced, lower carb meals. Smaller meal size, adding snacks. Choosing water as drink of choice and increasing purposeful exercise.       Relevant Orders   Hemoglobin A1c   Return in about 6 months (around 09/03/2022) for chonic disease management.     Vonna Kotyk, FNP, have reviewed all documentation for this visit. The documentation on 03/04/22 for the exam, diagnosis, procedures, and orders are all accurate and complete.  Gwyneth Sprout, [place] [phone] (phone) [phone] (fax)  [place]

## 2022-03-04 NOTE — Assessment & Plan Note (Signed)
Chronic, previously elevated at 127 Goal <100 recommend diet low in saturated fat and regular exercise - 30 min at least 5 times per week Continue red yeast rice OTC to assist Repeat LP

## 2022-03-04 NOTE — Assessment & Plan Note (Signed)
Chronic, stable At goal of <140/<90 Repeat CBC and CMP Continue Lisinopril 20 mg to assist

## 2022-03-04 NOTE — Assessment & Plan Note (Signed)
UTD on dental, vision scheduled for early 2024 UTD on vaccines with exception of shingles UTD on Colon, PAP and mammo Things to do to keep yourself healthy  - Exercise at least 30-45 minutes a day, 3-4 days a week.  - Eat a low-fat diet with lots of fruits and vegetables, up to 7-9 servings per day.  - Seatbelts can save your life. Wear them always.  - Smoke detectors on every level of your home, check batteries every year.  - Eye Doctor - have an eye exam every 1-2 years  - Safe sex - if you may be exposed to STDs, use a condom.  - Alcohol -  If you drink, do it moderately, less than 2 drinks per day.  - [place]. Choose someone to speak for you if you are not able.  - Depression is common in our stressful world.If you're feeling down or losing interest in things you normally enjoy, please come in for a visit.  - Violence - If anyone is threatening or hurting you, please call immediately.

## 2022-03-05 LAB — COMPREHENSIVE METABOLIC PANEL
ALT: 19 IU/L (ref 0–32)
AST: 19 IU/L (ref 0–40)
Albumin/Globulin Ratio: 2.1 (ref 1.2–2.2)
Albumin: 4.8 g/dL (ref 3.8–4.9)
Alkaline Phosphatase: 77 IU/L (ref 44–121)
BUN/Creatinine Ratio: 24 — ABNORMAL HIGH (ref 9–23)
BUN: 20 mg/dL (ref 6–24)
Bilirubin Total: 0.6 mg/dL (ref 0.0–1.2)
CO2: 26 mmol/L (ref 20–29)
Calcium: 10.1 mg/dL (ref 8.7–10.2)
Chloride: 103 mmol/L (ref 96–106)
Creatinine, Ser: 0.83 mg/dL (ref 0.57–1.00)
Globulin, Total: 2.3 g/dL (ref 1.5–4.5)
Glucose: 113 mg/dL — ABNORMAL HIGH (ref 70–99)
Potassium: 4.7 mmol/L (ref 3.5–5.2)
Sodium: 143 mmol/L (ref 134–144)
Total Protein: 7.1 g/dL (ref 6.0–8.5)
eGFR: 85 mL/min/{1.73_m2} (ref >59)

## 2022-03-05 LAB — CBC
Hematocrit: 42.2 % (ref 34.0–46.6)
Hemoglobin: 14 g/dL (ref 11.1–15.9)
MCH: 29.2 pg (ref 26.6–33.0)
MCHC: 33.2 g/dL (ref 31.5–35.7)
MCV: 88 fL (ref 79–97)
Platelets: 282 10*3/uL (ref 150–450)
RBC: 4.79 x10E6/uL (ref 3.77–5.28)
RDW: 12.3 % (ref 11.7–15.4)
WBC: 7 10*3/uL (ref 3.4–10.8)

## 2022-03-05 LAB — LIPID PANEL
Chol/HDL Ratio: 4.4 ratio (ref 0.0–4.4)
Cholesterol, Total: 238 mg/dL — ABNORMAL HIGH (ref 100–199)
HDL: 54 mg/dL (ref >39)
LDL Chol Calc (NIH): 169 mg/dL — ABNORMAL HIGH (ref 0–99)
Triglycerides: 87 mg/dL (ref 0–149)
VLDL Cholesterol Cal: 15 mg/dL (ref 5–40)

## 2022-03-05 LAB — HEMOGLOBIN A1C
Est. average glucose Bld gHb Est-mCnc: 123 mg/dL
Hgb A1c MFr Bld: 5.9 % — ABNORMAL HIGH (ref 4.8–5.6)

## 2022-03-07 ENCOUNTER — Encounter: Payer: Self-pay | Admitting: Family Medicine

## 2022-03-08 NOTE — Progress Notes (Signed)
Cholesterol is increased; The 10-year ASCVD risk score (Arnett DK, et al., 2019) is: 2.2%   Values used to calculate the score:     Age: 51 years     Sex: Female     Is Non-Hispanic African American: No     Diabetic: No     Tobacco smoker: No     Systolic Blood Pressure: 397 mmHg     Is BP treated: Yes     HDL Cholesterol: 54 mg/dL     Total Cholesterol: 238 mg/dL  I continue to recommend diet low in saturated fat and regular exercise - 30 min at least 5 times per week  Pre-diabetes remains; slight increase. Recommend repeat 6 months. Continue to recommend balanced, lower carb meals. Smaller meal size, adding snacks. Choosing water as drink of choice and increasing purposeful exercise.  Gwyneth Sprout, [address] [place], [place] [address] [phone] (phone) [phone] (fax) [place]

## 2022-04-04 ENCOUNTER — Ambulatory Visit
Admission: RE | Admit: 2022-04-04 | Discharge: 2022-04-04 | Disposition: A | Discharge status: Home / Self Care | Payer: BC Managed Care – PPO | Source: Ambulatory Visit | Attending: Family Medicine | Admitting: Family Medicine

## 2022-04-04 DIAGNOSIS — Z1231 Encounter for screening mammogram for malignant neoplasm of breast: Secondary | ICD-10-CM | POA: Diagnosis not present

## 2022-04-05 NOTE — Progress Notes (Signed)
Hi Eve  Normal mammogram; repeat in 1 year.  Please let us know if you have any questions.  Thank you,  Tally Joe, FNP

## 2022-05-13 ENCOUNTER — Other Ambulatory Visit: Payer: Self-pay | Admitting: Family Medicine

## 2022-05-13 DIAGNOSIS — E7800 Pure hypercholesterolemia, unspecified: Secondary | ICD-10-CM

## 2022-06-29 ENCOUNTER — Other Ambulatory Visit: Payer: Self-pay | Admitting: Family Medicine

## 2022-06-29 DIAGNOSIS — M159 Polyosteoarthritis, unspecified: Secondary | ICD-10-CM

## 2022-06-29 DIAGNOSIS — M150 Primary generalized (osteo)arthritis: Secondary | ICD-10-CM

## 2022-06-30 NOTE — Telephone Encounter (Signed)
Requested Prescriptions  Pending Prescriptions Disp Refills   ibuprofen (ADVIL) 800 MG tablet [Pharmacy Med Name: IBUPROFEN 800 MG TABLET] 270 tablet 0    Sig: TAKE ONE TABLET BY MOUTH THREE TIMES A DAY     Analgesics:  NSAIDS Failed - 06/29/2022 10:09 PM      Failed - Manual Review: Labs are only required if the patient has taken medication for more than 8 weeks.      Passed - Cr in normal range and within 360 days    Creat  Date Value Ref Range Status  01/09/2017 0.83 0.50 - 1.10 mg/dL Final   Creatinine, Ser  Date Value Ref Range Status  03/04/2022 0.83 0.57 - 1.00 mg/dL Final         Passed - HGB in normal range and within 360 days    Hemoglobin  Date Value Ref Range Status  03/04/2022 14.0 11.1 - 15.9 g/dL Final         Passed - PLT in normal range and within 360 days    Platelets  Date Value Ref Range Status  03/04/2022 282 150 - 450 x10E3/uL Final         Passed - HCT in normal range and within 360 days    Hematocrit  Date Value Ref Range Status  03/04/2022 42.2 34.0 - 46.6 % Final         Passed - eGFR is 30 or above and within 360 days    GFR, Est African American  Date Value Ref Range Status  01/09/2017 98 > OR = 60 mL/min/1.46m2 Final   GFR calc Af Amer  Date Value Ref Range Status  02/17/2020 119 >59 mL/min/1.73 Final    Comment:    **In accordance with recommendations from the NKF-ASN Task force,**   [facility] is in the process of updating its eGFR calculation to the   2021 CKD-EPI creatinine equation that estimates kidney function   without a race variable.    GFR, Est Non African American  Date Value Ref Range Status  01/09/2017 85 > OR = 60 mL/min/1.83m2 Final   GFR calc non Af Amer  Date Value Ref Range Status  02/17/2020 103 >59 mL/min/1.73 Final   eGFR  Date Value Ref Range Status  03/04/2022 85 >59 mL/min/1.73 Final         Passed - Patient is not pregnant      Passed - Valid encounter within last 12 months    Recent Outpatient  Visits           3 months ago Annual physical exam   [facility] Jacky Kindle, FNP   11 months ago Primary hypertension   [facility] Merita Norton T, FNP   1 year ago Acute non-recurrent frontal sinusitis   [facility] Alfredia Ferguson, PA-C   1 year ago Annual physical exam   [facility] Jacky Kindle, FNP   1 year ago Acute non-recurrent maxillary sinusitis   [facility] Jacky Kindle, FNP

## 2022-07-05 ENCOUNTER — Encounter: Payer: Self-pay | Admitting: Family Medicine

## 2022-08-25 IMAGING — MG DIGITAL SCREENING BILAT W/ TOMO W/ CAD
8 series · 8 of 24 positions shown · non-contrast
Comparison: Previous exam(s).

CLINICAL DATA: Screening.

EXAM:
DIGITAL SCREENING BILATERAL MAMMOGRAM WITH TOMO AND CAD

[L CC synth-2D]
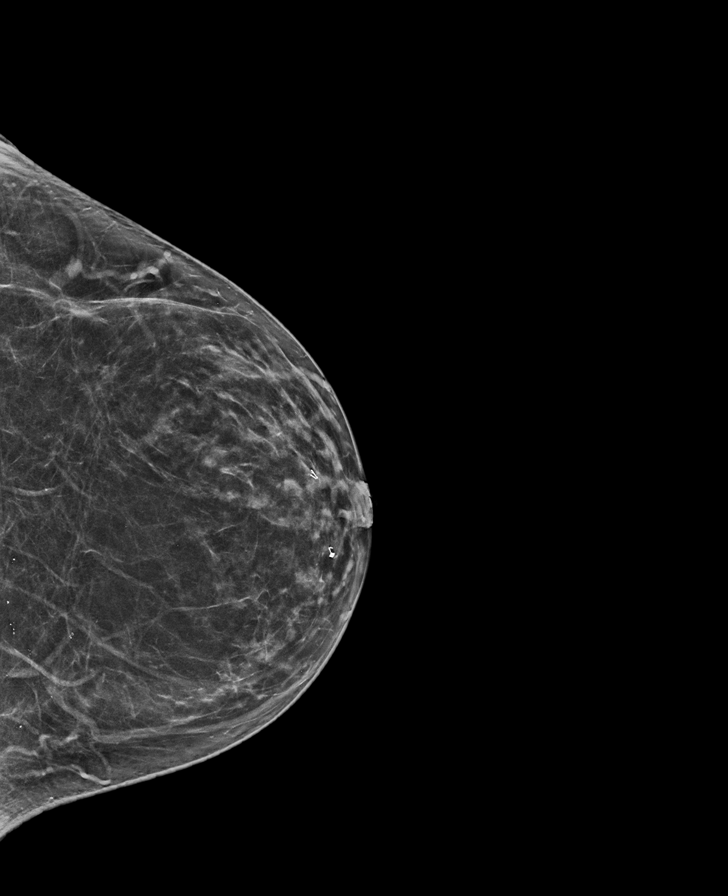

[R CC synth-2D]
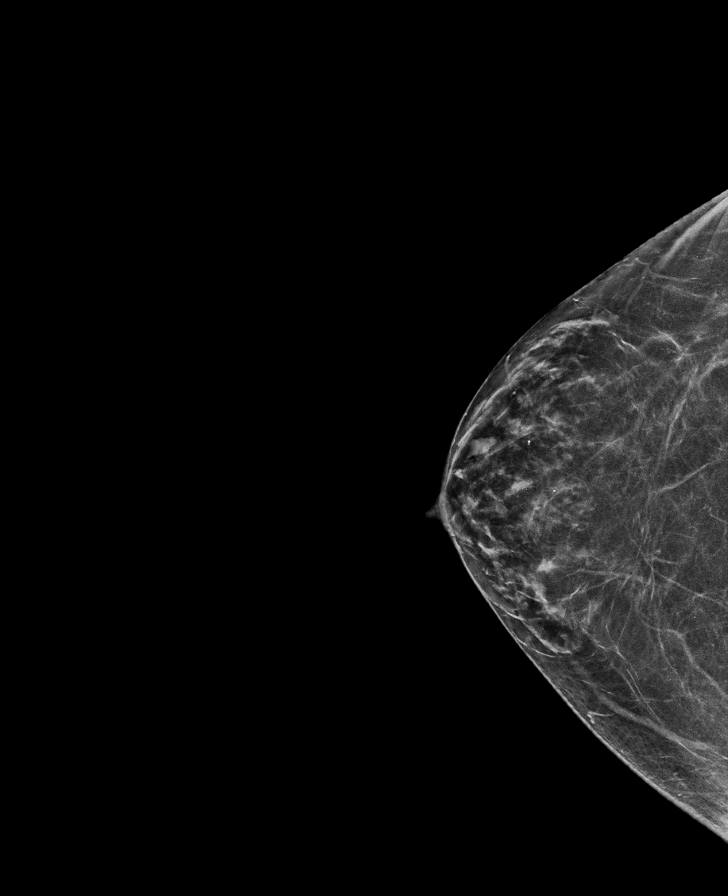

[R MLO synth-2D]
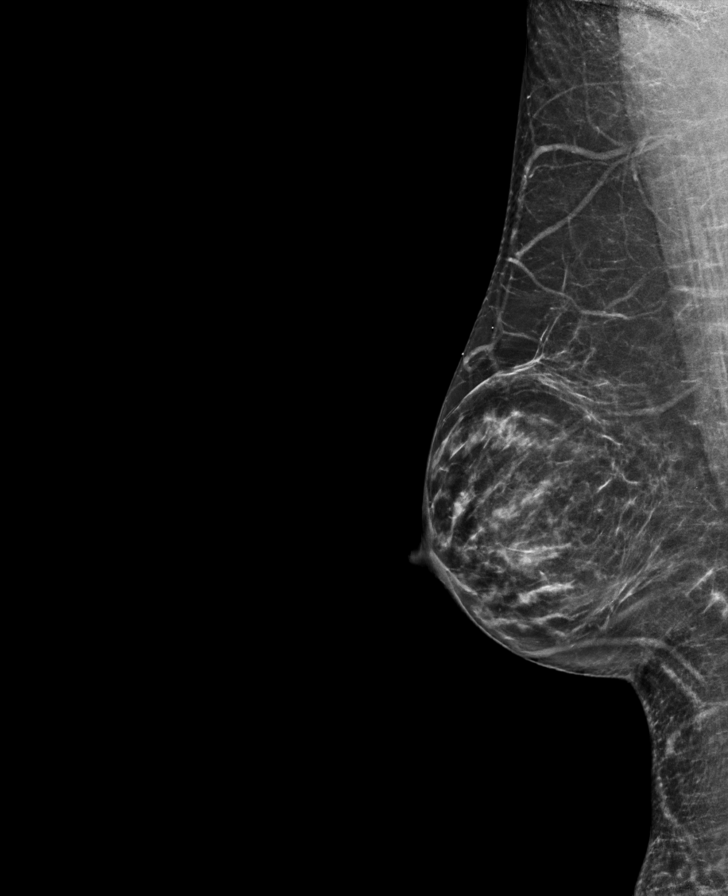

[L MLO synth-2D]
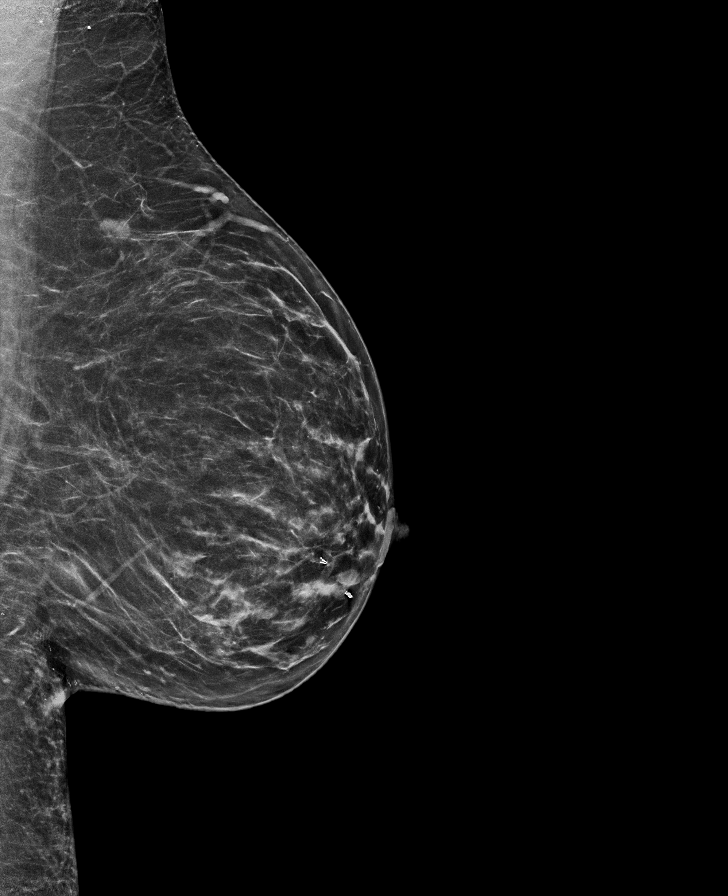

[L CC tomo · tomo slice 35/68.0]
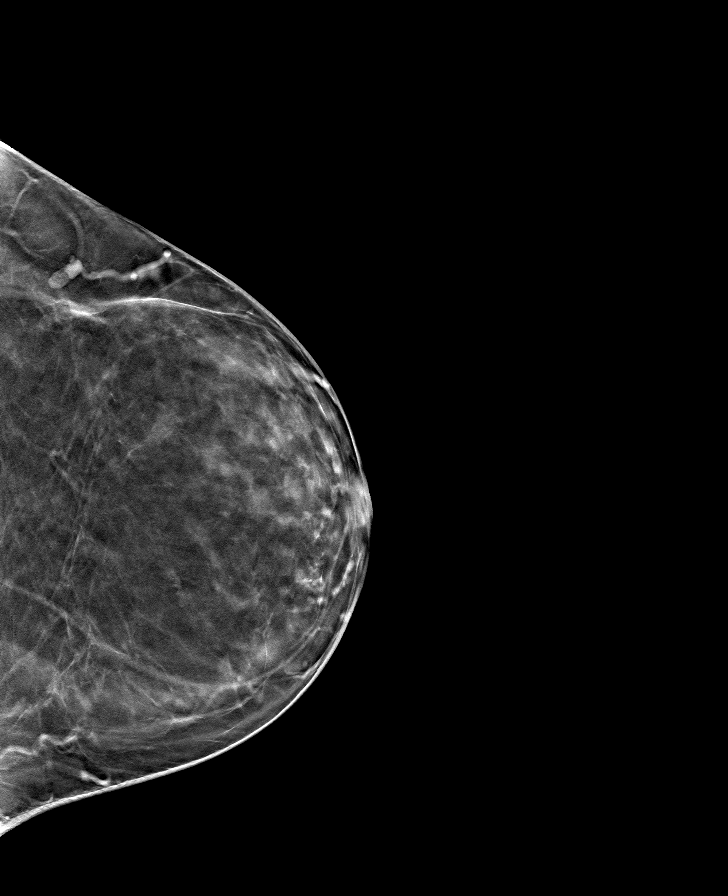

[L MLO tomo · tomo slice 33/65.0]
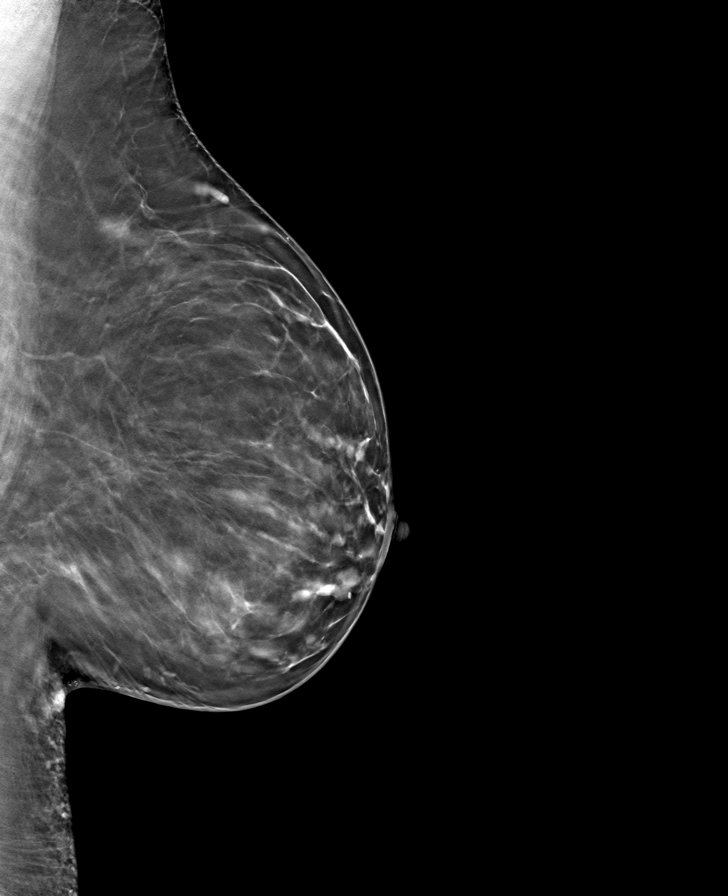

[R CC tomo · tomo slice 31/62.0]
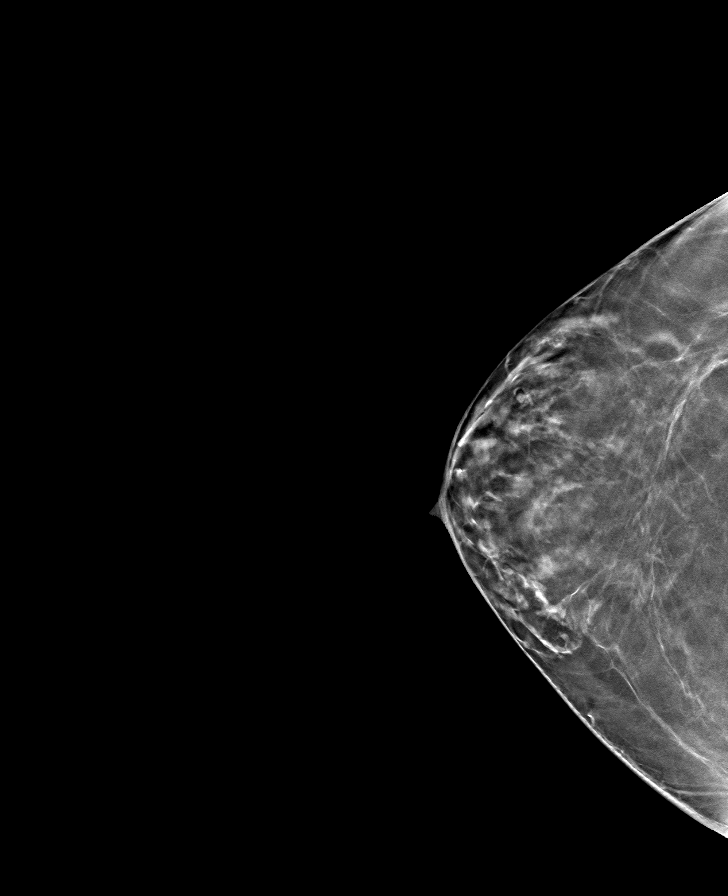

[R MLO tomo · tomo slice 31/62.0]
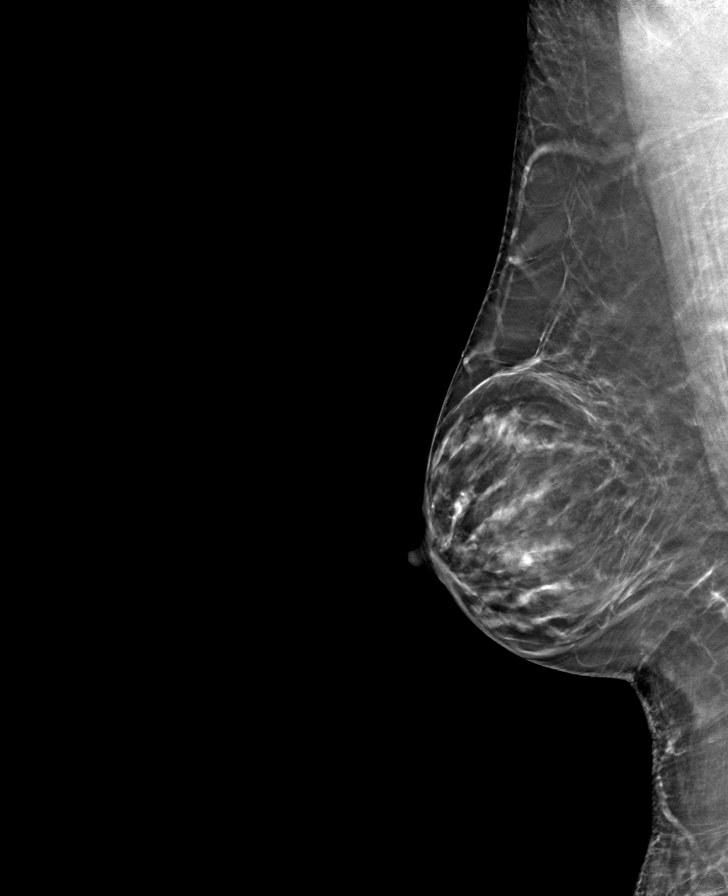

[8 of 24 positions shown; findings below may reference images not displayed]

ACR Breast Density Category b: There are scattered areas of
fibroglandular density.
FINDINGS: There are no findings suspicious for malignancy. Images were
processed with CAD.
IMPRESSION: No mammographic evidence of malignancy. A result letter of this
screening mammogram will be mailed directly to the patient.

RECOMMENDATION:
Screening mammogram in one year. (Code:[id number])

BI-RADS CATEGORY  1: Negative.

## 2022-10-12 ENCOUNTER — Encounter: Payer: Self-pay | Admitting: Family Medicine

## 2022-10-12 ENCOUNTER — Ambulatory Visit (INDEPENDENT_AMBULATORY_CARE_PROVIDER_SITE_OTHER): Payer: PRIVATE HEALTH INSURANCE | Admitting: Family Medicine

## 2022-10-12 VITALS — BP 136/81 | HR 68 | Temp 98.6°F | Ht 64.0 in | Wt 200.5 lb

## 2022-10-12 DIAGNOSIS — M159 Polyosteoarthritis, unspecified: Secondary | ICD-10-CM | POA: Diagnosis not present

## 2022-10-12 DIAGNOSIS — U071 COVID-19: Secondary | ICD-10-CM | POA: Diagnosis not present

## 2022-10-12 MED ORDER — NIRMATRELVIR/RITONAVIR (PAXLOVID)TABLET: 3.0000 | ORAL_TABLET | Freq: Two times a day (BID) | ORAL | 0 refills | Status: AC

## 2022-10-12 MED ORDER — HYDROCOD POLI-CHLORPHE POLI ER 10-8 MG/5ML PO SUER: 5.0000 mL | Freq: Every evening | ORAL | 0 refills | Status: AC | PRN

## 2022-10-12 NOTE — Progress Notes (Signed)
Established patient visit   Patient: Monica Nelson   DOB: 1970-06-17   51 y.o. Female  MRN: [id number] Visit Date: 10/12/2022  Today's healthcare provider: Jacky Kindle, FNP  Introduced to nurse practitioner role and practice setting.  All questions answered.  Discussed provider/patient relationship and expectations.  Chief Complaint  Patient presents with   Covid Positive    Patient reports symptoms started off as a sore throat, nose burning and dry cough. She reports he cough has eased up but all other symptoms are the same and now she can no longer taste or smell anything. Her symptoms started yesterday afternoon after work and she took her test immediately after and was positive. Patient has taken nyquil so she can sleep and only took one pill. She has been made aware 3 other coworker are positive as well. She will need a work note for days out of work.    Subjective    HPI HPI     Covid Positive    Additional comments: Patient reports symptoms started off as a sore throat, nose burning and dry cough. She reports he cough has eased up but all other symptoms are the same and now she can no longer taste or smell anything. Her symptoms started yesterday afternoon after work and she took her test immediately after and was positive. Patient has taken nyquil so she can sleep and only took one pill. She has been made aware 3 other coworker are positive as well. She will need a work note for days out of work.       Last edited by Acey Lav, CMA on 10/12/2022 11:07 AM.      Medications: Outpatient Medications Prior to Visit  Medication Sig   amoxicillin-clavulanate (AUGMENTIN) 875-125 MG tablet Take 1 tablet by mouth 2 (two) times daily.   Ascorbic Acid (VITAMIN C) 1000 MG tablet Take 1,000 mg by mouth daily.    azelastine (ASTELIN) 0.1 % nasal spray Place 2 sprays into both nostrils 2 (two) times daily. Use in each nostril as directed   Calcium Carbonate-Vitamin D  600-200 MG-UNIT TABS Take 1 tablet by mouth daily.   cetirizine (ZYRTEC) 10 MG tablet Take 10 mg by mouth daily.   fluticasone (FLONASE) 50 MCG/ACT nasal spray SPRAY 2 SPRAYS INTO EACH NOSTRIL EVERY DAY   ibuprofen (ADVIL) 800 MG tablet TAKE ONE TABLET BY MOUTH THREE TIMES A DAY   levonorgestrel (MIRENA) 20 MCG/24HR IUD MIRENA, 20MCG/24HR (Intrauterine Intrauterine Device)  for 0 days  Quantity: 0.00;  Refills: 0   Ordered :19-Nov-2009  Kavin Leech ;  Started 29-Jun-2009 Active Comments: DX: 626.2   lisinopril (ZESTRIL) 20 MG tablet TAKE ONE TABLET BY MOUTH DAILY   Misc Natural Products (OSTEO BI-FLEX ADV JOINT SHIELD) TABS Take 2 tablets by mouth daily.    Red Yeast Rice 600 MG CAPS Take 1-2 capsules by mouth daily.   [DISCONTINUED] chlorpheniramine-HYDROcodone (TUSSIONEX) 10-8 MG/5ML Take 5 mLs by mouth at bedtime as needed for cough.   No facility-administered medications prior to visit.     Objective    BP 136/81 (BP Location: Left Arm, Patient Position: Sitting, Cuff Size: Normal)   Pulse 68   Temp 98.6 F (37 C) (Oral)   Ht 5\' 4"  (1.626 m)   Wt 200 lb 8 oz (90.9 kg)   SpO2 99%   BMI 34.42 kg/m   Physical Exam Vitals and nursing note reviewed.  Constitutional:      General: She  is not in acute distress.    Appearance: Normal appearance. She is obese. She is not ill-appearing, toxic-appearing or diaphoretic.  HENT:     Head: Normocephalic and atraumatic.     Right Ear: Tympanic membrane, ear canal and external ear normal.     Left Ear: Tympanic membrane, ear canal and external ear normal.     Nose: Congestion present.     Mouth/Throat:     Mouth: Mucous membranes are moist.  Eyes:     Extraocular Movements: Extraocular movements intact.     Conjunctiva/sclera: Conjunctivae normal.     Pupils: Pupils are equal, round, and reactive to light.  Cardiovascular:     Rate and Rhythm: Normal rate and regular rhythm.     Pulses: Normal pulses.     Heart sounds: Normal  heart sounds. No murmur heard.    No friction rub. No gallop.  Pulmonary:     Effort: Pulmonary effort is normal. No respiratory distress.     Breath sounds: Normal breath sounds. No stridor. No wheezing, rhonchi or rales.  Chest:     Chest wall: No tenderness.  Musculoskeletal:        General: No swelling, tenderness, deformity or signs of injury. Normal range of motion.     Cervical back: Normal range of motion and neck supple.     Right lower leg: No edema.     Left lower leg: No edema.  Skin:    General: Skin is warm and dry.     Capillary Refill: Capillary refill takes less than 2 seconds.     Coloration: Skin is not jaundiced or pale.     Findings: No bruising, erythema, lesion or rash.  Neurological:     General: No focal deficit present.     Mental Status: She is alert and oriented to person, place, and time. Mental status is at baseline.     Cranial Nerves: No cranial nerve deficit.     Sensory: No sensory deficit.     Motor: No weakness.     Coordination: Coordination normal.  Psychiatric:        Mood and Affect: Mood normal.        Behavior: Behavior normal.        Thought Content: Thought content normal.        Judgment: Judgment normal.      No results found for any visits on 10/12/22.  Assessment & Plan     Problem List Items Addressed This Visit       Musculoskeletal and Integument   Primary osteoarthritis involving multiple joints    Request to be seen by ortho for evaluation       Relevant Orders   Ambulatory referral to Orthopedics     Other   COVID - Primary    <24 hours of symptoms; home test positive with known exposure Reviewed eGFR Continue supportive care and antiviral F/u as needed Pt request for additional cough syrup to assist       Relevant Medications   nirmatrelvir/ritonavir (PAXLOVID) 20 x 150 MG & 10 x 100MG  TABS   chlorpheniramine-HYDROcodone (TUSSIONEX) 10-8 MG/5ML   Return if symptoms worsen or fail to improve.     Leilani Merl, FNP, have reviewed all documentation for this visit. The documentation on 10/12/22 for the exam, diagnosis, procedures, and orders are all accurate and complete.  Jacky Kindle, FNP  [facility] [phone] (phone) [phone] (fax)  [facility]

## 2022-10-12 NOTE — Assessment & Plan Note (Signed)
<  24 hours of symptoms; home test positive with known exposure Reviewed eGFR Continue supportive care and antiviral F/u as needed Pt request for additional cough syrup to assist

## 2022-10-12 NOTE — Assessment & Plan Note (Signed)
Request to be seen by ortho for evaluation

## 2022-10-13 ENCOUNTER — Encounter: Payer: Self-pay | Admitting: Family Medicine

## 2023-01-12 ENCOUNTER — Other Ambulatory Visit: Payer: Self-pay | Admitting: Family Medicine

## 2023-01-12 DIAGNOSIS — I10 Essential (primary) hypertension: Secondary | ICD-10-CM

## 2023-02-15 ENCOUNTER — Other Ambulatory Visit: Payer: Self-pay | Admitting: Family Medicine

## 2023-03-01 ENCOUNTER — Other Ambulatory Visit: Payer: Self-pay | Admitting: Family Medicine

## 2023-03-01 DIAGNOSIS — M150 Primary generalized (osteo)arthritis: Secondary | ICD-10-CM

## 2023-03-02 ENCOUNTER — Other Ambulatory Visit: Payer: Self-pay | Admitting: Family Medicine

## 2023-03-02 DIAGNOSIS — M150 Primary generalized (osteo)arthritis: Secondary | ICD-10-CM

## 2023-03-02 MED ORDER — IBUPROFEN 800 MG PO TABS
800.0000 mg | ORAL_TABLET | Freq: Three times a day (TID) | ORAL | 0 refills | Status: DC
Start: 2023-03-02 — End: 2023-09-19

## 2023-03-02 NOTE — Telephone Encounter (Signed)
Please advise 

## 2023-03-06 ENCOUNTER — Other Ambulatory Visit: Payer: Self-pay | Admitting: Family Medicine

## 2023-03-06 DIAGNOSIS — Z1231 Encounter for screening mammogram for malignant neoplasm of breast: Secondary | ICD-10-CM

## 2023-03-17 ENCOUNTER — Encounter: Payer: PRIVATE HEALTH INSURANCE | Admitting: Family Medicine

## 2023-04-10 ENCOUNTER — Encounter: Payer: PRIVATE HEALTH INSURANCE | Admitting: Family Medicine

## 2023-04-19 ENCOUNTER — Ambulatory Visit
Admission: RE | Admit: 2023-04-19 | Discharge: 2023-04-19 | Disposition: A | Discharge status: Home / Self Care | Payer: PRIVATE HEALTH INSURANCE | Source: Ambulatory Visit | Attending: Family Medicine | Admitting: Family Medicine

## 2023-04-19 DIAGNOSIS — Z1231 Encounter for screening mammogram for malignant neoplasm of breast: Secondary | ICD-10-CM | POA: Diagnosis present

## 2023-04-20 ENCOUNTER — Ambulatory Visit (INDEPENDENT_AMBULATORY_CARE_PROVIDER_SITE_OTHER): Payer: PRIVATE HEALTH INSURANCE | Admitting: Family Medicine

## 2023-04-20 ENCOUNTER — Encounter: Payer: Self-pay | Admitting: Family Medicine

## 2023-04-20 VITALS — BP 134/70 | HR 72 | Ht 64.0 in | Wt 198.0 lb

## 2023-04-20 DIAGNOSIS — I10 Essential (primary) hypertension: Secondary | ICD-10-CM

## 2023-04-20 DIAGNOSIS — E66811 Obesity, class 1: Secondary | ICD-10-CM

## 2023-04-20 DIAGNOSIS — Z0000 Encounter for general adult medical examination without abnormal findings: Secondary | ICD-10-CM | POA: Diagnosis not present

## 2023-04-20 DIAGNOSIS — E7800 Pure hypercholesterolemia, unspecified: Secondary | ICD-10-CM | POA: Diagnosis not present

## 2023-04-20 DIAGNOSIS — H66002 Acute suppurative otitis media without spontaneous rupture of ear drum, left ear: Secondary | ICD-10-CM

## 2023-04-20 DIAGNOSIS — R7303 Prediabetes: Secondary | ICD-10-CM

## 2023-04-20 MED ORDER — AMOXICILLIN 500 MG PO CAPS: 500.0000 mg | ORAL_CAPSULE | Freq: Three times a day (TID) | ORAL | 0 refills | Status: AC

## 2023-04-20 NOTE — Progress Notes (Addendum)
Complete physical exam   Patient: Monica Nelson   DOB: 26-Jun-1970   53 y.o. Female  MRN: [id number] Visit Date: 04/20/2023  Today's healthcare provider: Ronnald Ramp, MD   Chief Complaint  Patient presents with   Annual Exam   Subjective    [name] is a 53 y.o. female who presents today for a complete physical exam.   She reports consuming a general diet.   Exercise is limited by orthopedic condition(s): arthritis in the knees, reports walking with limp.    She does not have additional problems to discuss today.   Discussed the use of AI scribe software for clinical note transcription with the patient, who gave verbal consent to proceed.  History of Present Illness   The patient is a 53 year old female who presents for an annual physical exam.  She has a history of prediabetes and a BMI of 33.99, categorized as obesity. She attempts to walk for exercise, about 30 minutes twice a week, but finds it challenging due to knee pain. Her blood pressure was initially elevated at 152/72, but improved to 134/70 upon recheck.  She has osteoarthritis in her knees, described as 'bone on bone,' causing significant pain and limiting mobility. She was scheduled for knee surgery in January but canceled due to insurance issues. 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  She mentions a recent respiratory infection lasting ten days, with symptoms like feeling as if she is 'talking out of a barrel' and occasional facial pain. She has been taking Coricidin for blood pressure and previously used Augmentin, which helped clear similar symptoms in the past. No fever is reported, but she experiences facial pain and a sensation of fluid in her ears.  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         Past Medical History:  Diagnosis Date   Allergic rhinitis    Complex endometrial hyperplasia without atypia    currently treat with Mirena   Hypertension    Past Surgical History:  Procedure Laterality Date   BREAST BIOPSY Left 02/04/2014 x 2   negative   ENDOMETRIAL BIOPSY     May 2010 complex hyperplasia, Nov 2011 prolif endometrium, 2014 prolif   HYSTEROSCOPY WITH D & C  2010   Social History   Socioeconomic History   Marital status: Single    Spouse name: Not on file   Number of children: Not on file   Years of education: Not on file   Highest education level: 12th grade  Occupational History   Occupation: Land  Tobacco Use   Smoking status: Never   Smokeless tobacco: Never  Vaping Use   Vaping status: Never Used  Substance and Sexual Activity   Alcohol use: Yes    Comment: occasional   Drug use: No   Sexual activity: Yes    Birth control/protection: I.U.D.  Other Topics Concern   Not on file  Social History Narrative   Also works in Insurance account manager office   Social Drivers of Health   Financial Resource Strain: Low Risk  (04/17/2023)   Overall Financial Resource Strain (CARDIA)    Difficulty of Paying Living Expenses: Not hard at all  Food Insecurity: No  Food Insecurity (04/17/2023)   Hunger Vital Sign    Worried About Running Out of Food in the Last Year: Never true    Ran Out of Food in the Last Year: Never true  Transportation Needs: No Transportation Needs (04/17/2023)   PRAPARE - Administrator, Civil Service (Medical): No    Lack of Transportation (Non-Medical): No  Physical Activity: Unknown (04/17/2023)   Exercise Vital Sign    Days of Exercise per Week: 0 days    Minutes of Exercise per Session: Not on file  Stress: No Stress Concern Present (04/17/2023)   [employer] of Occupational Health - Occupational  Stress Questionnaire    Feeling of Stress : Not at all  Social Connections: Socially Isolated (04/17/2023)   Social Connection and Isolation Panel [NHANES]    Frequency of Communication with Friends and Family: Once a week    Frequency of Social Gatherings with Friends and Family: Twice a week    Attends Religious Services: Never    Database administrator or Organizations: No    Attends Engineer, structural: Not on file    Marital Status: Never married  Catering manager Violence: Not on file   Family Status  Relation Name Status   Mother  Alive   Father  Deceased at age 13       bone cancer   Neg Hx  (Not Specified)  No partnership data on file   Family History  Problem Relation Age of Onset   Healthy Mother    Thyroid disease Mother    Bone cancer Father    Breast cancer Neg Hx    No Known Allergies   Medications: Outpatient Medications Prior to Visit  Medication Sig   Ascorbic Acid (VITAMIN C) 1000 MG tablet Take 1,000 mg by mouth daily.    azelastine (ASTELIN) 0.1 % nasal spray Place 2 sprays into both nostrils 2 (two) times daily. Use in each nostril as directed   Calcium Carbonate-Vitamin D 600-200 MG-UNIT TABS Take 1 tablet by mouth daily.   cetirizine (ZYRTEC) 10 MG tablet Take 10 mg by mouth daily.   chlorpheniramine-HYDROcodone (TUSSIONEX) 10-8 MG/5ML Take 5 mLs by mouth at bedtime as needed for cough.   fluticasone (FLONASE) 50 MCG/ACT nasal spray SPRAY 2 SPRAYS INTO EACH NOSTRIL EVERY DAY   ibuprofen (ADVIL) 800 MG tablet Take 1 tablet (800 mg total) by mouth 3 (three) times daily.   levonorgestrel (MIRENA) 20 MCG/24HR IUD MIRENA, 20MCG/24HR (Intrauterine Intrauterine Device)  for 0 days  Quantity: 0.00;  Refills: 0   Ordered :19-Nov-2009  Kavin Leech ;  Started 29-Jun-2009 Active Comments: DX: 626.2   lisinopril (ZESTRIL) 20 MG tablet TAKE 1 TABLET BY MOUTH DAILY   Misc Natural Products (OSTEO BI-FLEX ADV JOINT SHIELD) TABS Take 2 tablets by mouth  daily.    Red Yeast Rice 600 MG CAPS Take 1-2 capsules by mouth daily.   [DISCONTINUED] amoxicillin-clavulanate (AUGMENTIN) 875-125 MG tablet Take 1 tablet by mouth 2 (two) times daily. (Patient not taking: Reported on 04/20/2023)   No facility-administered medications prior to visit.    Review of Systems  Last CBC Lab Results  Component Value Date   WBC 7.1 04/20/2023   HGB 13.6 04/20/2023   HCT 42.1 04/20/2023   MCV 89 04/20/2023   MCH 28.6 04/20/2023   RDW 12.8 04/20/2023   PLT 280 04/20/2023   Last metabolic panel Lab Results  Component Value Date   GLUCOSE 99 04/20/2023  NA 142 04/20/2023   K 4.4 04/20/2023   CL 102 04/20/2023   CO2 23 04/20/2023   BUN 19 04/20/2023   CREATININE 0.72 04/20/2023   EGFR 101 04/20/2023   CALCIUM 9.9 04/20/2023   PROT 7.0 04/20/2023   ALBUMIN 4.5 04/20/2023   LABGLOB 2.5 04/20/2023   AGRATIO 2.1 03/04/2022   BILITOT 0.3 04/20/2023   ALKPHOS 78 04/20/2023   AST 23 04/20/2023   ALT 23 04/20/2023   Last lipids Lab Results  Component Value Date   CHOL 207 (H) 04/20/2023   HDL 45 04/20/2023   LDLCALC 146 (H) 04/20/2023   TRIG 86 04/20/2023   CHOLHDL 4.6 (H) 04/20/2023   Last hemoglobin A1c Lab Results  Component Value Date   HGBA1C 6.0 (H) 04/20/2023   Last thyroid functions Lab Results  Component Value Date   TSH 1.590 04/20/2023       Objective    BP 134/70 (Cuff Size: Normal)   Pulse 72   Ht 5\' 4"  (1.626 m)   Wt 198 lb (89.8 kg)   SpO2 99%   BMI 33.99 kg/m  BP Readings from Last 3 Encounters:  04/20/23 134/70  10/12/22 136/81  03/04/22 123/79   Wt Readings from Last 3 Encounters:  04/20/23 198 lb (89.8 kg)  10/12/22 200 lb 8 oz (90.9 kg)  03/04/22 197 lb 9.6 oz (89.6 kg)        Physical Exam Vitals reviewed.  Constitutional:      General: She is not in acute distress.    Appearance: Normal appearance. She is not ill-appearing, toxic-appearing or diaphoretic.  HENT:     Head: Normocephalic and  atraumatic.     Right Ear: Tympanic membrane and external ear normal. There is no impacted cerumen.     Left Ear: External ear normal. A middle ear effusion is present. There is no impacted cerumen.     Nose: Nose normal.     Mouth/Throat:     Pharynx: Oropharynx is clear.  Eyes:     General: No scleral icterus.    Extraocular Movements: Extraocular movements intact.     Conjunctiva/sclera: Conjunctivae normal.     Pupils: Pupils are equal, round, and reactive to light.  Cardiovascular:     Rate and Rhythm: Normal rate and regular rhythm.     Pulses: Normal pulses.     Heart sounds: Normal heart sounds. No murmur heard.    No friction rub. No gallop.  Pulmonary:     Effort: Pulmonary effort is normal. No respiratory distress.     Breath sounds: Normal breath sounds. No wheezing, rhonchi or rales.  Abdominal:     General: Bowel sounds are normal. There is no distension.     Palpations: Abdomen is soft. There is no mass.     Tenderness: There is no abdominal tenderness. There is no guarding.  Musculoskeletal:        General: No deformity.     Cervical back: Normal range of motion and neck supple. No rigidity.     Right knee: Decreased range of motion. Tenderness present.     Right lower leg: No edema.     Left lower leg: No edema.  Lymphadenopathy:     Cervical: No cervical adenopathy.  Skin:    General: Skin is warm.     Capillary Refill: Capillary refill takes less than 2 seconds.     Findings: No erythema or rash.  Neurological:     General: No focal deficit present.  Mental Status: She is alert and oriented to person, place, and time.     Motor: No weakness.     Gait: Gait normal.  Psychiatric:        Mood and Affect: Mood normal.        Behavior: Behavior normal.       Last depression screening scores    04/20/2023    2:39 PM 03/04/2022    8:21 AM 07/14/2021    2:29 PM  PHQ 2/9 Scores  PHQ - 2 Score 0 0 0  PHQ- 9 Score 0 0 0    Last fall risk  screening    03/04/2022    8:21 AM  Fall Risk   Falls in the past year? 0  Number falls in past yr: 0  Injury with Fall? 0  Risk for fall due to : No Fall Risks  Follow up Falls evaluation completed    Last Audit-C alcohol use screening    04/17/2023    9:40 AM  Alcohol Use Disorder Test (AUDIT)  1. How often do you have a drink containing alcohol? 2  2. How many drinks containing alcohol do you have on a typical day when you are drinking? 0  3. How often do you have six or more drinks on one occasion? 0  AUDIT-C Score 2      Patient-reported   A score of 3 or more in women, and 4 or more in men indicates increased risk for alcohol abuse, EXCEPT if all of the points are from question 1   Results for orders placed or performed in visit on 04/20/23  CBC with Differential/Platelet  Result Value Ref Range   WBC 7.1 3.4 - 10.8 x10E3/uL   RBC 4.75 3.77 - 5.28 x10E6/uL   Hemoglobin 13.6 11.1 - 15.9 g/dL   Hematocrit 40.9 81.1 - 46.6 %   MCV 89 79 - 97 fL   MCH 28.6 26.6 - 33.0 pg   MCHC 32.3 31.5 - 35.7 g/dL   RDW 91.4 78.2 - 95.6 %   Platelets 280 150 - 450 x10E3/uL   Neutrophils 56 Not Estab. %   Lymphs 34 Not Estab. %   Monocytes 7 Not Estab. %   Eos 3 Not Estab. %   Basos 0 Not Estab. %   Neutrophils Absolute 3.9 1.4 - 7.0 x10E3/uL   Lymphocytes Absolute 2.4 0.7 - 3.1 x10E3/uL   Monocytes Absolute 0.5 0.1 - 0.9 x10E3/uL   EOS (ABSOLUTE) 0.2 0.0 - 0.4 x10E3/uL   Basophils Absolute 0.0 0.0 - 0.2 x10E3/uL   Immature Granulocytes 0 Not Estab. %   Immature Grans (Abs) 0.0 0.0 - 0.1 x10E3/uL  Comprehensive metabolic panel  Result Value Ref Range   Glucose 99 70 - 99 mg/dL   BUN 19 6 - 24 mg/dL   Creatinine, Ser 2.13 0.57 - 1.00 mg/dL   eGFR 086 >57 QI/ONG/2.95   BUN/Creatinine Ratio 26 (H) 9 - 23   Sodium 142 134 - 144 mmol/L   Potassium 4.4 3.5 - 5.2 mmol/L   Chloride 102 96 - 106 mmol/L   CO2 23 20 - 29 mmol/L   Calcium 9.9 8.7 - 10.2 mg/dL   Total Protein 7.0  6.0 - 8.5 g/dL   Albumin 4.5 3.8 - 4.9 g/dL   Globulin, Total 2.5 1.5 - 4.5 g/dL   Bilirubin Total 0.3 0.0 - 1.2 mg/dL   Alkaline Phosphatase 78 44 - 121 IU/L   AST 23 0 - 40 IU/L  ALT 23 0 - 32 IU/L  TSH  Result Value Ref Range   TSH 1.590 0.450 - 4.500 uIU/mL  Lipid panel  Result Value Ref Range   Cholesterol, Total 207 (H) 100 - 199 mg/dL   Triglycerides 86 0 - 149 mg/dL   HDL 45 >09 mg/dL   VLDL Cholesterol Cal 16 5 - 40 mg/dL   LDL Chol Calc (NIH) 811 (H) 0 - 99 mg/dL   Chol/HDL Ratio 4.6 (H) 0.0 - 4.4 ratio  Hemoglobin A1c  Result Value Ref Range   Hgb A1c MFr Bld 6.0 (H) 4.8 - 5.6 %   Est. average glucose Bld gHb Est-mCnc 126 mg/dL    Assessment & Plan    Routine Health Maintenance and Physical Exam  Immunization History  Administered Date(s) Administered   Influenza,inj,Quad PF,6+ Mos 03/07/2015, 02/07/2018, 12/28/2018, 01/07/2020, 12/31/2020   Influenza-Unspecified 01/16/2022   Pfizer(Comirnaty)Fall Seasonal Vaccine 12 years and older 01/16/2022   Td 05/07/2018   Tdap 07/29/2005    Health Maintenance  Topic Date Due   Zoster Vaccines- Shingrix (1 of 2) Never done   COVID-19 Vaccine (2 - 2024-25 season) 11/20/2022   INFLUENZA VACCINE  06/19/2023 (Originally 10/20/2022)   Fecal DNA (Cologuard)  03/18/2024   MAMMOGRAM  04/04/2024   Cervical Cancer Screening (HPV/Pap Cotest)  05/04/2025   DTaP/Tdap/Td (3 - Td or Tdap) 05/07/2028   Hepatitis C Screening  Completed   HIV Screening  Completed   HPV VACCINES  Aged Out    Problem List Items Addressed This Visit       Cardiovascular and Mediastinum   Primary hypertension   Elevated blood pressure readings noted during the visit (152/72 initially, 134/70 on recheck). Currently managing with Coricidin due to a recent respiratory infection. Discussed the importance of blood pressure control to prevent complications such as stroke and heart disease. - Monitor blood pressure -continue lisinopril 20mg           Other   Prediabetes   Relevant Orders   Hemoglobin A1c (Completed)   Obesity (BMI 30.0-34.9)   Relevant Orders   CBC with Differential/Platelet (Completed)   Comprehensive metabolic panel (Completed)   TSH (Completed)   Lipid panel (Completed)   Elevated LDL cholesterol level   Relevant Orders   Lipid panel (Completed)   Annual physical exam - Primary   Chronic conditions are stable  Patient was counseled on benefits of regular physical activity with goal of 150 minutes of moderate to vigurous intensity 4 days per week  Patient was counseled to consume well balanced diet of fruits, vegetables, limited saturated fats and limited sugary foods and beverages with emphasis on consuming 6-8 glasses of water daily  Screening recommended today: A1c, lipids,CMP,CBC  Colon cancer screening: negative cologuard 2023  Cervical CA screening: UTD completed 2022  Mammogram completed 04/19/23    Vaccines recommended today: COVID,Shingrix      Other Visit Diagnoses       Non-recurrent acute suppurative otitis media of left ear without spontaneous rupture of tympanic membrane       Relevant Medications   amoxicillin (AMOXIL) 500 MG capsule         Acute Otitis Media Acute otitis media in the left ear, likely secondary to a recent respiratory infection. Symptoms include a sensation of fluid in the ear and mild facial pain. No fever reported. Discussed the use of amoxicillin and the importance of completing the full course of antibiotics to prevent recurrence. - Prescribe amoxicillin 500 mg three times a  day for 7 days    Osteoarthritis of the Knee Chronic osteoarthritis in the left knee with bone-on-bone contact, causing significant pain and limiting mobility. Managing pain with ibuprofen 800 mg daily. Scheduled for knee replacement surgery, pending insurance issues. Discussed risks and benefits of surgery, including pain relief, improved mobility, potential surgical complications, and  postoperative rehabilitation. - Continue current pain management with ibuprofen 800 mg as needed - Encourage scheduling knee replacement surgery as soon as feasible  General Health Maintenance BMI is 33.99, indicating obesity. Discussed the importance of regular exercise and a balanced diet. - Encourage increased physical activity to at least 30 minutes of walking most days of the week - Discuss weight management strategies - Order fasting cholesterol panel, A1c, and thyroid levels  Follow-up - Follow up with lab results - Schedule next annual physical examination.           No follow-ups on file.       Ronnald Ramp, MD  [facility] [phone] (phone) [phone] (fax)  [facility]

## 2023-04-21 ENCOUNTER — Encounter: Payer: Self-pay | Admitting: Family Medicine

## 2023-04-21 LAB — CBC WITH DIFFERENTIAL/PLATELET
Basophils Absolute: 0 10*3/uL (ref 0.0–0.2)
Basos: 0 %
EOS (ABSOLUTE): 0.2 10*3/uL (ref 0.0–0.4)
Eos: 3 %
Hematocrit: 42.1 % (ref 34.0–46.6)
Hemoglobin: 13.6 g/dL (ref 11.1–15.9)
Immature Grans (Abs): 0 10*3/uL (ref 0.0–0.1)
Immature Granulocytes: 0 %
Lymphocytes Absolute: 2.4 10*3/uL (ref 0.7–3.1)
Lymphs: 34 %
MCH: 28.6 pg (ref 26.6–33.0)
MCHC: 32.3 g/dL (ref 31.5–35.7)
MCV: 89 fL (ref 79–97)
Monocytes Absolute: 0.5 10*3/uL (ref 0.1–0.9)
Monocytes: 7 %
Neutrophils Absolute: 3.9 10*3/uL (ref 1.4–7.0)
Neutrophils: 56 %
Platelets: 280 10*3/uL (ref 150–450)
RBC: 4.75 x10E6/uL (ref 3.77–5.28)
RDW: 12.8 % (ref 11.7–15.4)
WBC: 7.1 10*3/uL (ref 3.4–10.8)

## 2023-04-21 LAB — LIPID PANEL
Chol/HDL Ratio: 4.6 ratio — ABNORMAL HIGH (ref 0.0–4.4)
Cholesterol, Total: 207 mg/dL — ABNORMAL HIGH (ref 100–199)
HDL: 45 mg/dL
LDL Chol Calc (NIH): 146 mg/dL — ABNORMAL HIGH (ref 0–99)
Triglycerides: 86 mg/dL (ref 0–149)
VLDL Cholesterol Cal: 16 mg/dL (ref 5–40)

## 2023-04-21 LAB — COMPREHENSIVE METABOLIC PANEL WITH GFR
ALT: 23 [IU]/L (ref 0–32)
AST: 23 [IU]/L (ref 0–40)
Albumin: 4.5 g/dL (ref 3.8–4.9)
Alkaline Phosphatase: 78 [IU]/L (ref 44–121)
BUN/Creatinine Ratio: 26 — ABNORMAL HIGH (ref 9–23)
BUN: 19 mg/dL (ref 6–24)
Bilirubin Total: 0.3 mg/dL (ref 0.0–1.2)
CO2: 23 mmol/L (ref 20–29)
Calcium: 9.9 mg/dL (ref 8.7–10.2)
Chloride: 102 mmol/L (ref 96–106)
Creatinine, Ser: 0.72 mg/dL (ref 0.57–1.00)
Globulin, Total: 2.5 g/dL (ref 1.5–4.5)
Glucose: 99 mg/dL (ref 70–99)
Potassium: 4.4 mmol/L (ref 3.5–5.2)
Sodium: 142 mmol/L (ref 134–144)
Total Protein: 7 g/dL (ref 6.0–8.5)
eGFR: 101 mL/min/{1.73_m2}

## 2023-04-21 LAB — TSH: TSH: 1.59 u[IU]/mL (ref 0.450–4.500)

## 2023-04-21 LAB — HEMOGLOBIN A1C
Est. average glucose Bld gHb Est-mCnc: 126 mg/dL
Hgb A1c MFr Bld: 6 % — ABNORMAL HIGH (ref 4.8–5.6)

## 2023-04-21 NOTE — Assessment & Plan Note (Signed)
Elevated blood pressure readings noted during the visit (152/72 initially, 134/70 on recheck). Currently managing with Coricidin due to a recent respiratory infection. Discussed the importance of blood pressure control to prevent complications such as stroke and heart disease. - Monitor blood pressure -continue lisinopril 20mg 

## 2023-04-21 NOTE — Assessment & Plan Note (Addendum)
Chronic conditions are stable  Patient was counseled on benefits of regular physical activity with goal of 150 minutes of moderate to vigurous intensity 4 days per week  Patient was counseled to consume well balanced diet of fruits, vegetables, limited saturated fats and limited sugary foods and beverages with emphasis on consuming 6-8 glasses of water daily  Screening recommended today: A1c, lipids,CMP,CBC  Colon cancer screening: negative cologuard 2023  Cervical CA screening: UTD completed 2022  Mammogram completed 04/19/23    Vaccines recommended today: COVID,Shingrix

## 2023-04-24 ENCOUNTER — Encounter: Payer: Self-pay | Admitting: Family Medicine

## 2023-09-13 ENCOUNTER — Encounter: Payer: Self-pay | Admitting: Family Medicine

## 2023-09-13 DIAGNOSIS — M150 Primary generalized (osteo)arthritis: Secondary | ICD-10-CM

## 2023-09-13 HISTORY — PX: TOTAL KNEE ARTHROPLASTY: SHX125

## 2023-09-14 ENCOUNTER — Other Ambulatory Visit: Payer: Self-pay

## 2023-09-14 DIAGNOSIS — M150 Primary generalized (osteo)arthritis: Secondary | ICD-10-CM

## 2023-09-14 NOTE — Telephone Encounter (Signed)
 LOV 04/20/23 NOV none LRF 03/02/23 270 x 0

## 2023-09-19 MED ORDER — IBUPROFEN 800 MG PO TABS: 800.0000 mg | ORAL_TABLET | Freq: Three times a day (TID) | ORAL | 0 refills | Status: DC

## 2024-01-05 ENCOUNTER — Other Ambulatory Visit: Payer: Self-pay | Admitting: Family Medicine

## 2024-01-05 DIAGNOSIS — I10 Essential (primary) hypertension: Secondary | ICD-10-CM

## 2024-01-05 NOTE — Telephone Encounter (Signed)
[facility] faxed refill request for the following medications:  lisinopril (ZESTRIL) 20 MG tablet   Please advise.

## 2024-01-14 ENCOUNTER — Encounter: Payer: Self-pay | Admitting: Family Medicine

## 2024-01-14 DIAGNOSIS — I10 Essential (primary) hypertension: Secondary | ICD-10-CM

## 2024-01-15 MED ORDER — LISINOPRIL 20 MG PO TABS: 20.0000 mg | ORAL_TABLET | Freq: Every day | ORAL | 0 refills | Status: DC

## 2024-02-10 ENCOUNTER — Other Ambulatory Visit: Payer: Self-pay | Admitting: Family Medicine

## 2024-02-10 DIAGNOSIS — I10 Essential (primary) hypertension: Secondary | ICD-10-CM

## 2024-02-13 ENCOUNTER — Other Ambulatory Visit: Payer: Self-pay | Admitting: Family Medicine

## 2024-02-13 DIAGNOSIS — I10 Essential (primary) hypertension: Secondary | ICD-10-CM

## 2024-02-13 NOTE — Telephone Encounter (Unsigned)
 Copied from CRM #[id number]. Topic: Clinical - Medication Refill >> Feb 13, 2024 11:19 AM Myrick T wrote: Patient took her last dosage this morning / scheduled appt Dec 1st  Medication: lisinopril  (ZESTRIL ) 20 MG tablet  Has the patient contacted their pharmacy? Yes (Agent: If no, request that the patient contact the pharmacy for the refill. If patient does not wish to contact the pharmacy document the reason why and proceed with request.) (Agent: If yes, when and what did the pharmacy advise?)  This is the patient's preferred pharmacy:  [facility] [id number] [place], [place] - [address] [place] [address] Phone: [phone] Fax: [phone]  Is this the correct pharmacy for this prescription? Yes  Has the prescription been filled recently? Yes  Is the patient out of the medication? Yes  Has the patient been seen for an appointment in the last year OR does the patient have an upcoming appointment? Yes  Can we respond through MyChart? Yes  Agent: Please be advised that Rx refills may take up to 3 business days. We ask that you follow-up with your pharmacy.

## 2024-02-14 ENCOUNTER — Other Ambulatory Visit: Payer: Self-pay | Admitting: Family Medicine

## 2024-02-14 DIAGNOSIS — I10 Essential (primary) hypertension: Secondary | ICD-10-CM

## 2024-02-14 NOTE — Telephone Encounter (Signed)
 Duplicate request, refilled 02/14/24.  Requested Prescriptions  Pending Prescriptions Disp Refills   lisinopril  (ZESTRIL ) 20 MG tablet 30 tablet 0    Sig: Take 1 tablet (20 mg total) by mouth daily.     Cardiovascular:  ACE Inhibitors Failed - 02/14/2024  4:06 PM      Failed - Cr in normal range and within 180 days    Creat  Date Value Ref Range Status  01/09/2017 0.83 0.50 - 1.10 mg/dL Final   Creatinine, Ser  Date Value Ref Range Status  04/20/2023 0.72 0.57 - 1.00 mg/dL Final         Failed - K in normal range and within 180 days    Potassium  Date Value Ref Range Status  04/20/2023 4.4 3.5 - 5.2 mmol/L Final         Failed - Valid encounter within last 6 months    Recent Outpatient Visits   None            Passed - Patient is not pregnant      Passed - Last BP in normal range    BP Readings from Last 1 Encounters:  04/20/23 134/70

## 2024-02-19 ENCOUNTER — Encounter: Payer: Self-pay | Admitting: Family Medicine

## 2024-02-19 ENCOUNTER — Ambulatory Visit (INDEPENDENT_AMBULATORY_CARE_PROVIDER_SITE_OTHER): Payer: PRIVATE HEALTH INSURANCE | Admitting: Family Medicine

## 2024-02-19 VITALS — BP 154/88 | HR 79 | Resp 16 | Ht 64.0 in | Wt 213.7 lb

## 2024-02-19 DIAGNOSIS — R7303 Prediabetes: Secondary | ICD-10-CM | POA: Diagnosis not present

## 2024-02-19 DIAGNOSIS — I10 Essential (primary) hypertension: Secondary | ICD-10-CM

## 2024-02-19 DIAGNOSIS — M150 Primary generalized (osteo)arthritis: Secondary | ICD-10-CM | POA: Diagnosis not present

## 2024-02-19 DIAGNOSIS — E7800 Pure hypercholesterolemia, unspecified: Secondary | ICD-10-CM

## 2024-02-19 MED ORDER — IBUPROFEN 800 MG PO TABS: 800.0000 mg | ORAL_TABLET | Freq: Three times a day (TID) | ORAL | 0 refills | Status: AC

## 2024-02-19 NOTE — Progress Notes (Signed)
 Established patient visit   Patient: Monica Nelson   DOB: [birth year]   53 y.o. Female  MRN: [id number] Visit Date: 02/19/2024  Today's healthcare provider: Nancyann Perry, MD   Chief Complaint  Patient presents with   Medical Management of Chronic Issues    HTN   Medication Refill    Would like to get a refill on the IBU 800 mg.   Immunizations    Pneumococcal Vaccine-patient declined.   Subjective    Discussed the use of AI scribe software for clinical note transcription with the patient, who gave verbal consent to proceed.  History of Present Illness   Monica Nelson is a 53 year old female with hypertension who presents for follow-up on her blood pressure medications.  She has been on lisinopril  20 mg for a long time and notes that her blood pressure has been steadily increasing. She missed one day of medication after running out but has since resumed taking it. She does not recall taking any other blood pressure medications in the past, except for a period when she lost weight and was taken off medication, but has since gained weight and resumed lisinopril . She has not been monitoring her blood pressure at home.  She is concerned about her cholesterol and A1c levels, stating that her A1c is high. She is not managing her diet to control her blood sugar. No family history of diabetes.  She underwent a left knee replacement on September 13, 2023, and returned to work on December 14, 2023. Post-surgery, her A1c was noted to be elevated at 6.0. She takes ibuprofen  800 mg as needed for knee pain, which she uses sparingly. She no longer takes red yeast rice as it was not effective.      Lab Results  Component Value Date   NA 142 04/20/2023   K 4.4 04/20/2023   CREATININE 0.72 04/20/2023   EGFR 101 04/20/2023   GLUCOSE 99 04/20/2023   Lab Results  Component Value Date   HGBA1C 6.0 (H) 04/20/2023   Lab Results  Component Value Date   CHOL 207 (H) 04/20/2023   HDL  45 04/20/2023   LDLCALC 146 (H) 04/20/2023   TRIG 86 04/20/2023   CHOLHDL 4.6 (H) 04/20/2023     Medications: Outpatient Medications Prior to Visit  Medication Sig   Ascorbic Acid (VITAMIN C) 1000 MG tablet Take 1,000 mg by mouth daily.    azelastine  (ASTELIN ) 0.1 % nasal spray Place 2 sprays into both nostrils 2 (two) times daily. Use in each nostril as directed   Calcium Carbonate-Vitamin D 600-200 MG-UNIT TABS Take 1 tablet by mouth daily.   cetirizine (ZYRTEC) 10 MG tablet Take 10 mg by mouth daily.   chlorpheniramine-HYDROcodone  (TUSSIONEX) 10-8 MG/5ML Take 5 mLs by mouth at bedtime as needed for cough.   fluticasone  (FLONASE ) 50 MCG/ACT nasal spray SPRAY 2 SPRAYS INTO EACH NOSTRIL EVERY DAY   levonorgestrel  (MIRENA ) 20 MCG/24HR IUD MIRENA , 20MCG/24HR (Intrauterine Intrauterine Device)  for 0 days  Quantity: 0.00;  Refills: 0   Ordered :19-Nov-2009  Hope Doffing ;  Started 29-Jun-2009 Active Comments: DX: 626.2   lisinopril  (ZESTRIL ) 20 MG tablet TAKE 1 TABLET BY MOUTH DAILY   Misc Natural Products (OSTEO BI-FLEX ADV JOINT SHIELD) TABS Take 2 tablets by mouth daily.    ibuprofen  (ADVIL ) 800 MG tablet Take 1 tablet (800 mg total) by mouth 3 (three) times daily.   [DISCONTINUED] Red Yeast Rice 600 MG CAPS Take 1-2  capsules by mouth daily.   No facility-administered medications prior to visit.   Review of Systems  Constitutional:  Negative for appetite change, chills, fatigue and fever.  Respiratory:  Negative for chest tightness and shortness of breath.   Cardiovascular:  Negative for chest pain and palpitations.  Gastrointestinal:  Negative for abdominal pain, nausea and vomiting.  Neurological:  Negative for dizziness and weakness.       Objective    BP (!) 154/88 (BP Location: Left Arm, Patient Position: Sitting, Cuff Size: Normal)   Pulse 79   Resp 16   Ht 5' 4 (1.626 m)   Wt 213 lb 11.2 oz (96.9 kg)   SpO2 98%   BMI 36.68 kg/m   Physical Exam   General  appearance: Obese female, cooperative and in no acute distress Head: Normocephalic, without obvious abnormality, atraumatic Respiratory: Respirations even and unlabored, normal respiratory rate Extremities: All extremities are intact.  Skin: Skin color, texture, turgor normal. No rashes seen  Psych: Appropriate mood and affect. Neurologic: Mental status: Alert, oriented to person, place, and time, thought content appropriate.    Assessment & Plan    1. Primary hypertension (Primary) Uncontrolled, will either increase lisinopril , add hydrochlorothiazide, or add amlodipine after reviewing lab results.   2. Elevated LDL cholesterol level The 89-bzjm ASCVD risk score (Arnett DK, et al., 2019) is: 4.1%  Check lipids today  3. Prediabetes  - CBC - Comprehensive metabolic panel with GFR - Lipid panel - Hemoglobin A1c  4. Primary osteoarthritis involving multiple joints Doing well s/p left total knee in June with some occasional residual pain.   Refill  ibuprofen  (ADVIL ) 800 MG tablet; Take 1 tablet (800 mg total) by mouth 3 (three) times daily.  Dispense: 270 tablet; Refill: 0      Nancyann Perry, MD  [facility] [phone] (phone) [phone] (fax)  [facility]

## 2024-02-20 ENCOUNTER — Ambulatory Visit: Payer: Self-pay | Admitting: Family Medicine

## 2024-02-20 DIAGNOSIS — R7303 Prediabetes: Secondary | ICD-10-CM

## 2024-02-20 DIAGNOSIS — I10 Essential (primary) hypertension: Secondary | ICD-10-CM

## 2024-02-20 LAB — COMPREHENSIVE METABOLIC PANEL WITH GFR
ALT: 28 IU/L (ref 0–32)
AST: 24 IU/L (ref 0–40)
Albumin: 4.6 g/dL (ref 3.8–4.9)
Alkaline Phosphatase: 115 IU/L (ref 49–135)
BUN/Creatinine Ratio: 31 — ABNORMAL HIGH (ref 9–23)
BUN: 22 mg/dL (ref 6–24)
Bilirubin Total: 0.5 mg/dL (ref 0.0–1.2)
CO2: 24 mmol/L (ref 20–29)
Calcium: 10.2 mg/dL (ref 8.7–10.2)
Chloride: 102 mmol/L (ref 96–106)
Creatinine, Ser: 0.72 mg/dL (ref 0.57–1.00)
Globulin, Total: 2.5 g/dL (ref 1.5–4.5)
Glucose: 96 mg/dL (ref 70–99)
Potassium: 4.8 mmol/L (ref 3.5–5.2)
Sodium: 141 mmol/L (ref 134–144)
Total Protein: 7.1 g/dL (ref 6.0–8.5)
eGFR: 100 mL/min/1.73 (ref >59)

## 2024-02-20 LAB — CBC
Hematocrit: 43.8 % (ref 34.0–46.6)
Hemoglobin: 13.8 g/dL (ref 11.1–15.9)
MCH: 27.9 pg (ref 26.6–33.0)
MCHC: 31.5 g/dL (ref 31.5–35.7)
MCV: 89 fL (ref 79–97)
Platelets: 297 x10E3/uL (ref 150–450)
RBC: 4.95 x10E6/uL (ref 3.77–5.28)
RDW: 13.9 % (ref 11.7–15.4)
WBC: 7.7 x10E3/uL (ref 3.4–10.8)

## 2024-02-20 LAB — LIPID PANEL
Chol/HDL Ratio: 5.3 ratio — ABNORMAL HIGH (ref 0.0–4.4)
Cholesterol, Total: 252 mg/dL — ABNORMAL HIGH (ref 100–199)
HDL: 48 mg/dL (ref >39)
LDL Chol Calc (NIH): 158 mg/dL — ABNORMAL HIGH (ref 0–99)
Triglycerides: 247 mg/dL — ABNORMAL HIGH (ref 0–149)
VLDL Cholesterol Cal: 46 mg/dL — ABNORMAL HIGH (ref 5–40)

## 2024-02-20 LAB — HEMOGLOBIN A1C
Est. average glucose Bld gHb Est-mCnc: 134 mg/dL
Hgb A1c MFr Bld: 6.3 % — ABNORMAL HIGH (ref 4.8–5.6)

## 2024-02-20 MED ORDER — HYDROCHLOROTHIAZIDE 12.5 MG PO TABS: 12.5000 mg | ORAL_TABLET | Freq: Every day | ORAL | 3 refills | Status: AC

## 2024-02-20 MED ORDER — METFORMIN HCL ER 500 MG PO TB24: 500.0000 mg | ORAL_TABLET | Freq: Every day | ORAL | 3 refills | Status: AC

## 2024-03-15 ENCOUNTER — Other Ambulatory Visit: Payer: Self-pay | Admitting: Family Medicine

## 2024-03-15 DIAGNOSIS — I10 Essential (primary) hypertension: Secondary | ICD-10-CM

## 2024-04-05 ENCOUNTER — Other Ambulatory Visit: Payer: Self-pay

## 2024-04-05 DIAGNOSIS — Z1231 Encounter for screening mammogram for malignant neoplasm of breast: Secondary | ICD-10-CM

## 2024-04-05 NOTE — Telephone Encounter (Signed)
Mammogram referral placed.
